# Patient Record
Sex: Female | Born: 1965 | Race: White | Hispanic: No | Marital: Married | Smoking: Former smoker
Health system: Southern US, Community
[De-identification: ages and names within clinical notes are randomized; demographics above are authoritative.]

## PROBLEM LIST (undated history)

## (undated) DIAGNOSIS — O009 Unspecified ectopic pregnancy without intrauterine pregnancy: Secondary | ICD-10-CM

## (undated) DIAGNOSIS — I10 Essential (primary) hypertension: Secondary | ICD-10-CM

## (undated) DIAGNOSIS — O039 Complete or unspecified spontaneous abortion without complication: Secondary | ICD-10-CM

## (undated) HISTORY — PX: CYST REMOVAL HAND: SHX6279

## (undated) HISTORY — DX: Essential (primary) hypertension: I10

## (undated) HISTORY — DX: Complete or unspecified spontaneous abortion without complication: O03.9

## (undated) HISTORY — PX: LIVER BIOPSY: SHX301

## (undated) HISTORY — PX: SALPINGECTOMY: SHX328

## (undated) HISTORY — DX: Unspecified ectopic pregnancy without intrauterine pregnancy: O00.90

---

## 2000-08-25 ENCOUNTER — Encounter: Payer: Self-pay | Admitting: Emergency Medicine

## 2000-08-25 ENCOUNTER — Emergency Department (HOSPITAL_COMMUNITY): Admission: EM | Admit: 2000-08-25 | Discharge: 2000-08-25 | Payer: Self-pay | Admitting: *Deleted

## 2010-01-14 ENCOUNTER — Ambulatory Visit: Payer: Self-pay | Admitting: Family Medicine

## 2016-01-06 ENCOUNTER — Other Ambulatory Visit: Payer: Self-pay | Admitting: Nurse Practitioner

## 2016-01-06 DIAGNOSIS — Z1231 Encounter for screening mammogram for malignant neoplasm of breast: Secondary | ICD-10-CM

## 2016-01-11 ENCOUNTER — Ambulatory Visit
Admission: RE | Admit: 2016-01-11 | Discharge: 2016-01-11 | Disposition: A | Discharge status: Home / Self Care | Payer: BLUE CROSS/BLUE SHIELD | Source: Ambulatory Visit | Attending: Nurse Practitioner | Admitting: Nurse Practitioner

## 2016-01-11 ENCOUNTER — Other Ambulatory Visit: Payer: Self-pay | Admitting: Nurse Practitioner

## 2016-01-11 DIAGNOSIS — Z1231 Encounter for screening mammogram for malignant neoplasm of breast: Secondary | ICD-10-CM | POA: Diagnosis present

## 2017-06-19 ENCOUNTER — Emergency Department
Admission: EM | Admit: 2017-06-19 | Discharge: 2017-06-19 | Disposition: A | Discharge status: Home / Self Care | Payer: BLUE CROSS/BLUE SHIELD | Attending: Emergency Medicine | Admitting: Emergency Medicine

## 2017-06-19 ENCOUNTER — Other Ambulatory Visit: Payer: Self-pay

## 2017-06-19 DIAGNOSIS — S39012A Strain of muscle, fascia and tendon of lower back, initial encounter: Secondary | ICD-10-CM | POA: Insufficient documentation

## 2017-06-19 DIAGNOSIS — X509XXA Other and unspecified overexertion or strenuous movements or postures, initial encounter: Secondary | ICD-10-CM | POA: Diagnosis not present

## 2017-06-19 DIAGNOSIS — Y92810 Car as the place of occurrence of the external cause: Secondary | ICD-10-CM | POA: Diagnosis not present

## 2017-06-19 DIAGNOSIS — Y9389 Activity, other specified: Secondary | ICD-10-CM | POA: Insufficient documentation

## 2017-06-19 DIAGNOSIS — Y998 Other external cause status: Secondary | ICD-10-CM | POA: Insufficient documentation

## 2017-06-19 DIAGNOSIS — S3992XA Unspecified injury of lower back, initial encounter: Secondary | ICD-10-CM | POA: Diagnosis present

## 2017-06-19 MED ORDER — BACLOFEN 10 MG PO TABS: 10.0000 mg | ORAL_TABLET | Freq: Every day | ORAL | 1 refills | Status: AC

## 2017-06-19 MED ORDER — METHYLPREDNISOLONE 4 MG PO TBPK: ORAL_TABLET | ORAL | 0 refills | Status: DC

## 2017-06-19 MED ORDER — TRAMADOL HCL 50 MG PO TABS: 50.0000 mg | ORAL_TABLET | Freq: Four times a day (QID) | ORAL | 0 refills | Status: DC | PRN

## 2017-06-19 NOTE — Discharge Instructions (Signed)
Use the medication as prescribed.  You may want to wait and start her steroid tomorrow morning.  Apply ice to the area that hurts.  You may use wet heat followed by ice.  You may want to see a chiropractor.  Or you could call orthopedics the phone numbers been provided for you if you are not better in 7-10 days.  Be careful and try not to lift anything greater than 10 pounds for the next week

## 2017-06-19 NOTE — ED Triage Notes (Signed)
Ptc/o lower back pain that started Thursday after working on a car.

## 2017-06-19 NOTE — ED Triage Notes (Signed)
FIRST NURSE NOTE-hurt lower back working on car. Ambulatory.

## 2017-06-19 NOTE — ED Provider Notes (Signed)
[facility] Emergency Department Provider Note  ____________________________________________   First MD Initiated Contact with Patient 06/19/17 1927     (approximate)  I have reviewed the triage vital signs and the nursing notes.   HISTORY  Chief Complaint Back Pain    HPI Melissa Johns is a 52 y.o. female is here complaining of low back pain.  She was leaning over her car and strained her back.  She is taking ibuprofen with very minimal relief.  She states the pain is a throbbing type pain.  She denies any other injuries.  She denies numbness or tingling in her legs.  History reviewed. No pertinent past medical history.  There are no active problems to display for this patient.   History reviewed. No pertinent surgical history.  Prior to Admission medications   Medication Sig Start Date End Date Taking? Authorizing Provider  baclofen (LIORESAL) 10 MG tablet Take 1 tablet (10 mg total) by mouth daily. 06/19/17 06/19/18  Latroya Ng, Roselyn BeringSusan W, PA-C  methylPREDNISolone (MEDROL DOSEPAK) 4 MG TBPK tablet Take 6 pills on day one then decrease by 1 pill each day 06/19/17   Faythe GheeFisher, Cachet Mccutchen W, PA-C  traMADol (ULTRAM) 50 MG tablet Take 1 tablet (50 mg total) by mouth every 6 (six) hours as needed. 06/19/17   Faythe GheeFisher, Clemens Lachman W, PA-C    Allergies Penicillins  Family History  Problem Relation Age of Onset  . Breast cancer Sister [id number]    Social History Social History   Tobacco Use  . Smoking status: Never Smoker  . Smokeless tobacco: Never Used  Substance Use Topics  . Alcohol use: No    Frequency: Never  . Drug use: No    Review of Systems  Constitutional: No fever/chills Eyes: No visual changes. ENT: No sore throat. Respiratory: Denies cough Gastrointestinal: Denies abdominal pain Genitourinary: Negative for dysuria. Musculoskeletal: Positive for back pain. Skin: Negative for rash.    ____________________________________________   PHYSICAL  EXAM:  VITAL SIGNS: ED Triage Vitals  Enc Vitals Group     BP 06/19/17 1857 (!) 150/99     Pulse Rate 06/19/17 1856 87     Resp 06/19/17 1856 17     Temp 06/19/17 1856 97.9 F (36.6 C)     Temp Source 06/19/17 1856 Oral     SpO2 06/19/17 1856 97 %     Weight 06/19/17 1857 (!) 332 lb (150.6 kg)     Height 06/19/17 1857 5\' 10"  (1.778 m)     Head Circumference --      Peak Flow --      Pain Score 06/19/17 1856 8     Pain Loc --      Pain Edu? --      Excl. in GC? --     Constitutional: Alert and oriented. Well appearing and in no acute distress. Eyes: Conjunctivae are normal.  Head: Atraumatic. Nose: No congestion/rhinnorhea. Mouth/Throat: Mucous membranes are moist.   Cardiovascular: Normal rate, regular rhythm. Respiratory: Normal respiratory effort.  No retractions GU: deferred Musculoskeletal: FROM all extremities, warm and well perfused.  The lumbar spine is minimally tender.  The paravertebral muscles and the muscles leading to the SI joint are spasmed and tender.  Patient has full reflexes.  She has full strength in both legs.  She is neurovascularly intact.  Pain is reproduced with bending forward Neurologic:  Normal speech and language.  Skin:  Skin is warm, dry and intact. No rash noted. Psychiatric: Mood and affect  are normal. Speech and behavior are normal.  ____________________________________________   LABS (all labs ordered are listed, but only abnormal results are displayed)  Labs Reviewed - No data to display ____________________________________________   ____________________________________________  RADIOLOGY    ____________________________________________   PROCEDURES  Procedure(s) performed: No  Procedures    ____________________________________________   INITIAL IMPRESSION / ASSESSMENT AND PLAN / ED COURSE  Pertinent labs & imaging results that were available during my care of the patient were reviewed by me and considered in my  medical decision making (see chart for details).  Patient is a 52 year old female reporting to the emergency department complaining of low back pain after bending over her car for too long.  Physical exam the lumbar spine and paravertebral muscles are tender and spasmed  Is diagnosed with lumbar strain.  She is given a prescription for Medrol Dosepak, baclofen, and tramadol 50 mg 1 every 6 hours as needed for pain #15 with no refill.  She is to apply ice followed by wet heat.  She is to follow-up with orthopedics or a chiropractor if not better in 5-7 days.  She is not to lift over 10 pounds while at work.  Patient states she understands comply with recommendations.  She was discharged in stable condition     As part of my medical decision making, I reviewed the following data within the electronic MEDICAL RECORD NUMBER Nursing notes reviewed and incorporated, Old chart reviewed, Notes from prior ED visits and [place] Controlled Substance Database  ____________________________________________   FINAL CLINICAL IMPRESSION(S) / ED DIAGNOSES  Final diagnoses:  Strain of lumbar region, initial encounter      NEW MEDICATIONS STARTED DURING THIS VISIT:  New Prescriptions   BACLOFEN (LIORESAL) 10 MG TABLET    Take 1 tablet (10 mg total) by mouth daily.   METHYLPREDNISOLONE (MEDROL DOSEPAK) 4 MG TBPK TABLET    Take 6 pills on day one then decrease by 1 pill each day   TRAMADOL (ULTRAM) 50 MG TABLET    Take 1 tablet (50 mg total) by mouth every 6 (six) hours as needed.     Note:  This document was prepared using Dragon voice recognition software and may include unintentional dictation errors.    Faythe Ghee, PA-C 06/19/17 Jamal Collin, MD 06/19/17 [phone]

## 2017-06-19 NOTE — ED Notes (Signed)
Pulled left side of back on Thursday working on a car. Has taken ibuprofen with minimal relief. No medications today

## 2018-01-27 IMAGING — MG MM DIGITAL SCREENING BILAT W/ TOMO W/ CAD
8 of 15 series · 8 of 31 positions shown · non-contrast
Comparison: Previous exam(s).

CLINICAL DATA: Screening.

EXAM:
2D DIGITAL SCREENING BILATERAL MAMMOGRAM WITH CAD AND ADJUNCT TOMO

[R CC (1 of 2)]
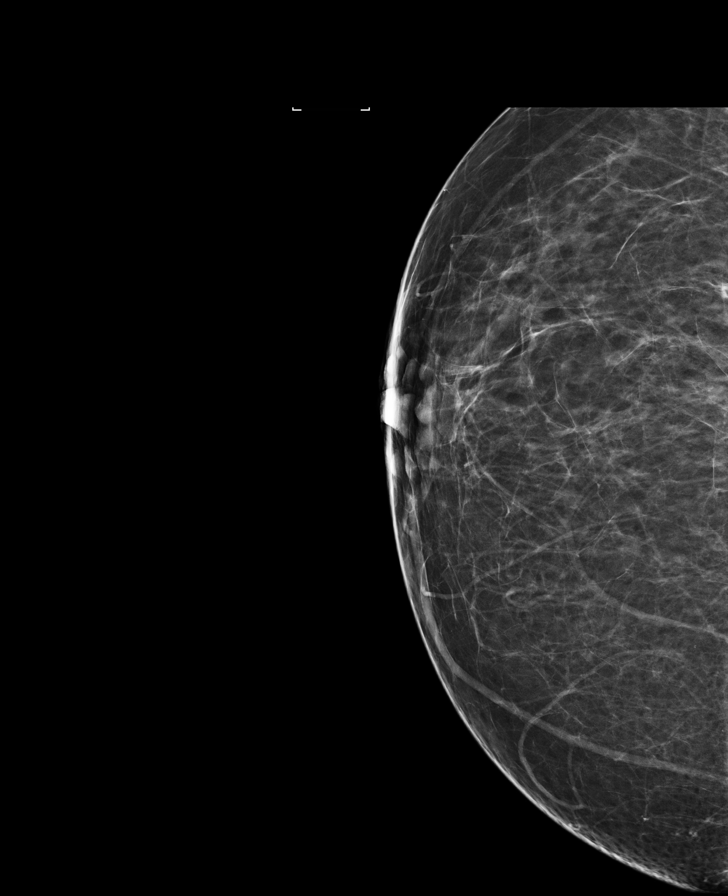

[L CC (1 of 2)]
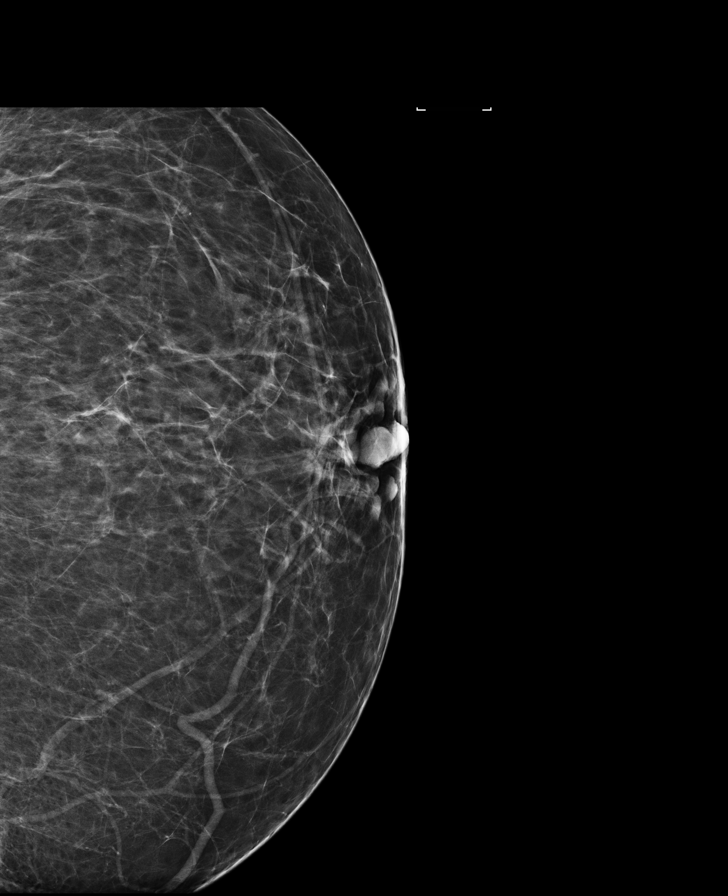

[L CC (2 of 2)]
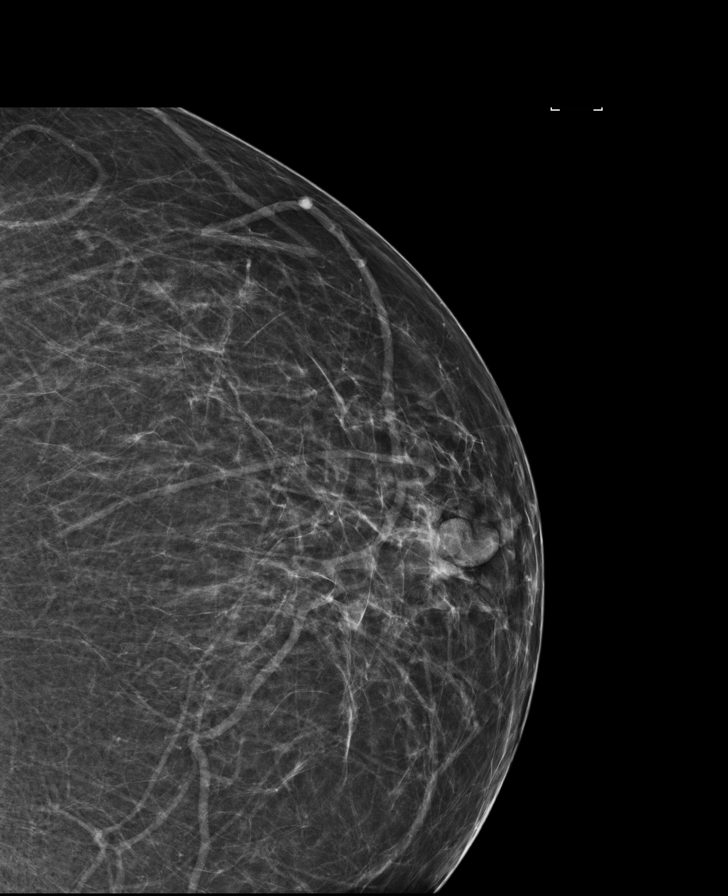

[R CC (2 of 2)]
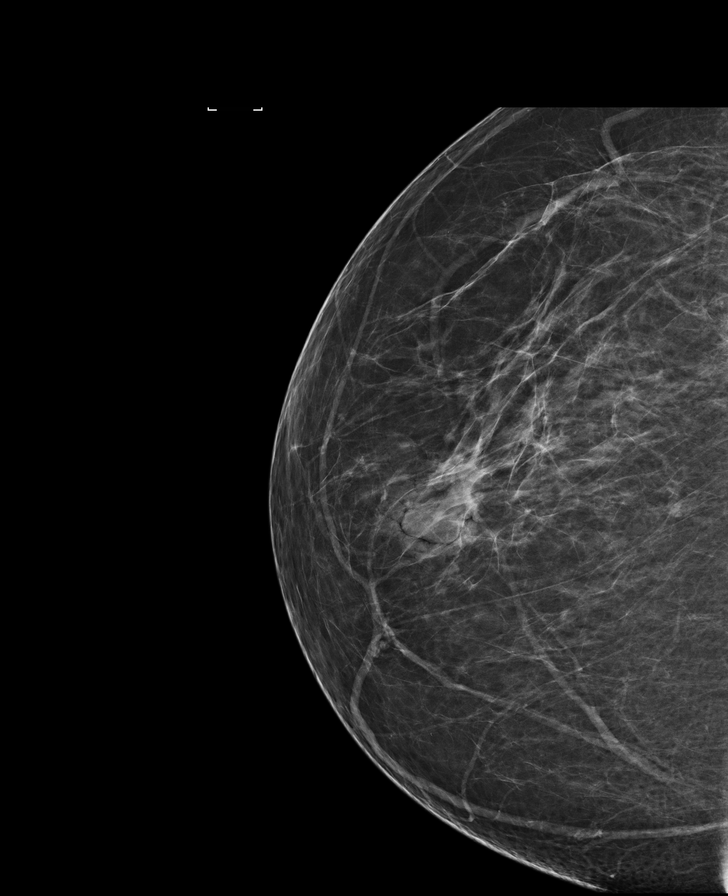

[L MLO synth-2D]
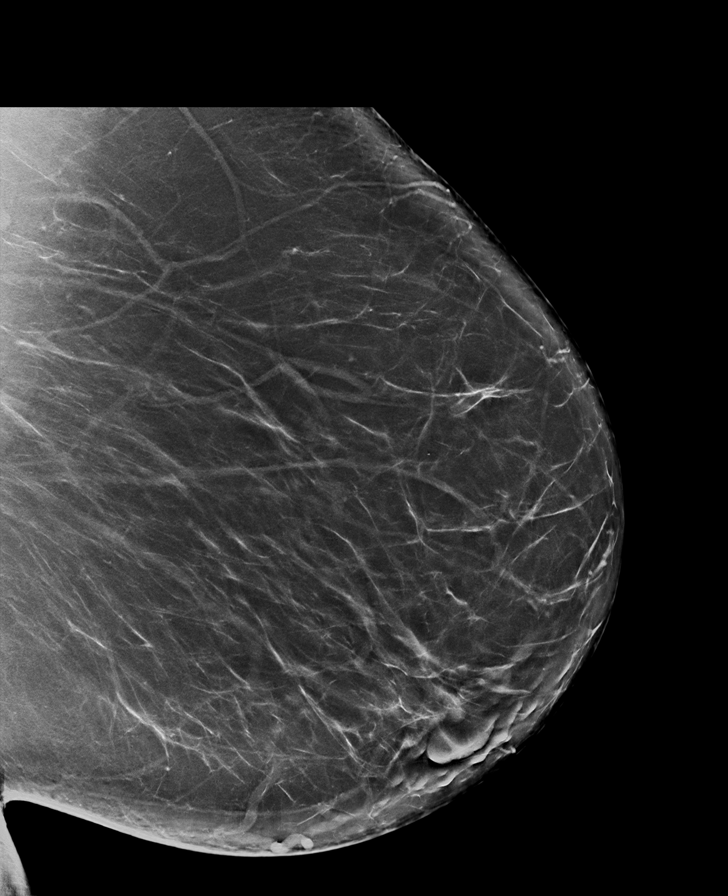

[R MLO]
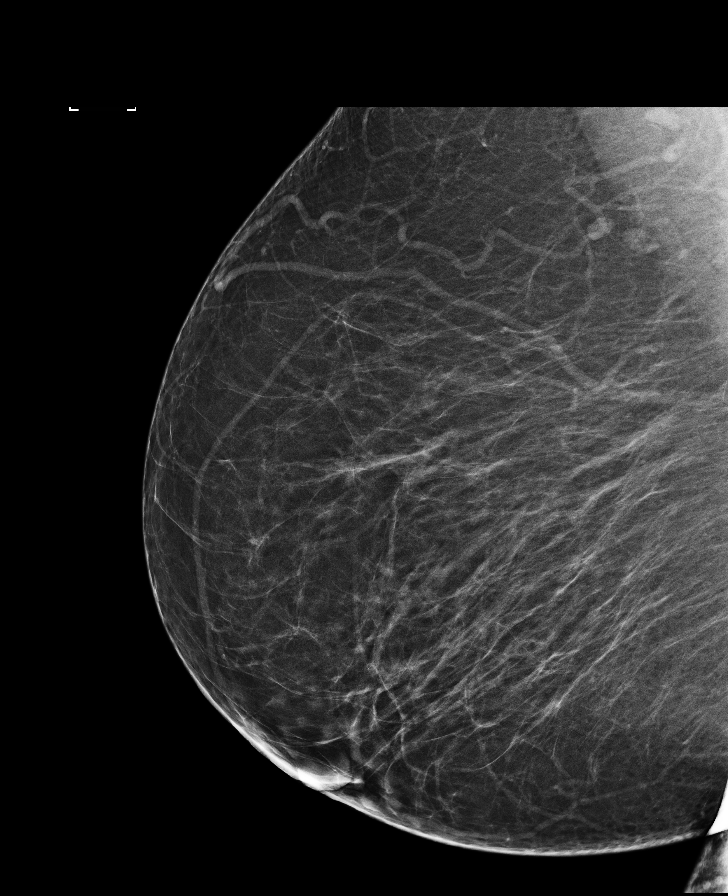

[L CC synth-2D]
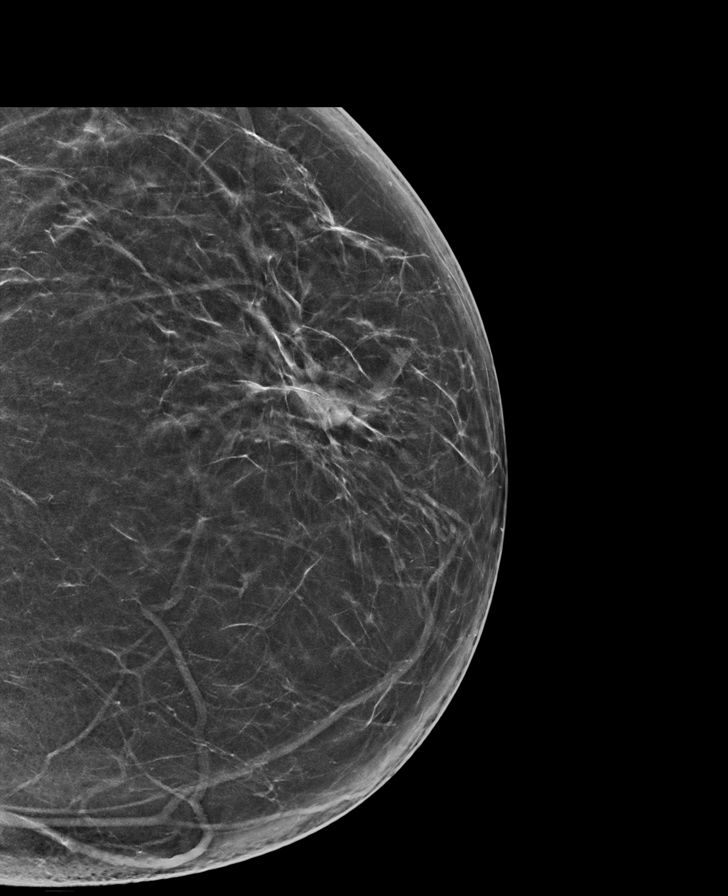

[L MLO]
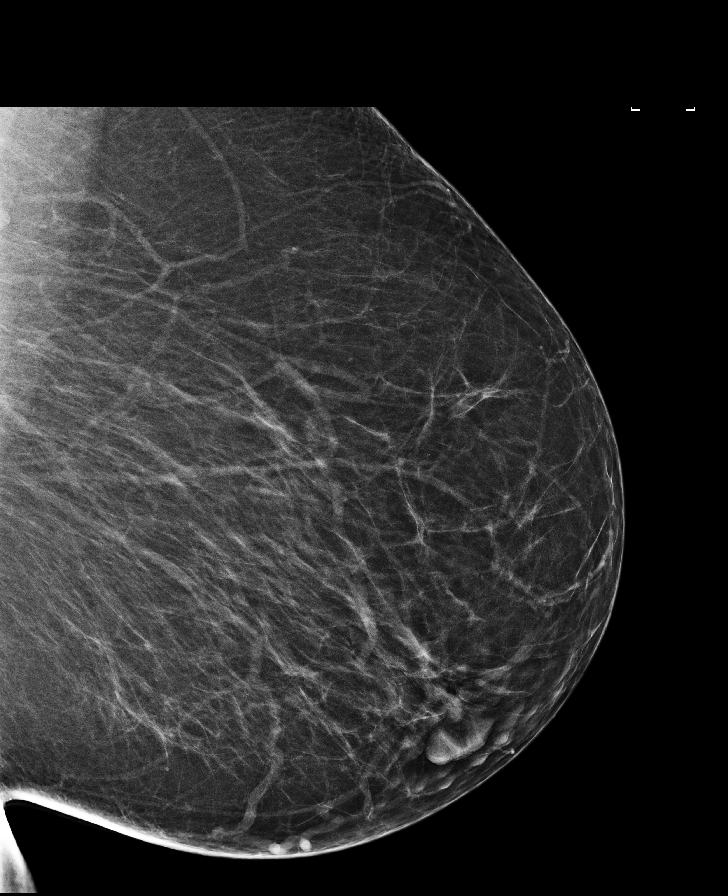

[8 of 31 positions shown; findings below may reference images not displayed]

ACR Breast Density Category b: There are scattered areas of
fibroglandular density.
FINDINGS: There are no findings suspicious for malignancy. Images were
processed with CAD.
IMPRESSION: No mammographic evidence of malignancy. A result letter of this
screening mammogram will be mailed directly to the patient.

RECOMMENDATION:
Screening mammogram in one year. (Code:97-6-RS4)

BI-RADS CATEGORY  1: Negative.

## 2019-11-11 ENCOUNTER — Ambulatory Visit: Payer: Self-pay

## 2019-11-11 DIAGNOSIS — Z23 Encounter for immunization: Secondary | ICD-10-CM

## 2019-11-11 NOTE — Progress Notes (Signed)
   Covid-19 Vaccination Clinic  Name:  Melissa Johns    MRN: [id number] DOB: 1965-07-15  11/11/2019  Melissa Johns was observed post Covid-19 immunization for 15 minutes without incident. She was provided with Vaccine Information Sheet and instruction to access the V-Safe system.   Melissa Johns was instructed to call 911 with any severe reactions post vaccine: Marland Kitchen Difficulty breathing  . Swelling of face and throat  . A fast heartbeat  . A bad rash all over body  . Dizziness and weakness   Immunizations Administered    Name Date Dose VIS Date Route   Moderna COVID-19 Vaccine 11/11/2019 11:56 AM 0.5 mL 04/2019 Intramuscular   Manufacturer: Moderna   Lot: [id number]   NDC: 75916-384-66

## 2019-12-09 ENCOUNTER — Ambulatory Visit: Payer: Self-pay | Attending: Internal Medicine

## 2019-12-09 DIAGNOSIS — Z23 Encounter for immunization: Secondary | ICD-10-CM

## 2019-12-09 NOTE — Progress Notes (Signed)
° °  Covid-19 Vaccination Clinic  Name:  Melissa Johns    MRN: [id number] DOB: 1965-09-20  12/09/2019  [name] was observed post Covid-19 immunization for 15 minutes without incident. She was provided with Vaccine Information Sheet and instruction to access the V-Safe system.   [name] was instructed to call 911 with any severe reactions post vaccine:  Difficulty breathing   Swelling of face and throat   A fast heartbeat   A bad rash all over body   Dizziness and weakness   Immunizations Administered    Name Date Dose VIS Date Route   Moderna COVID-19 Vaccine 12/09/2019 12:09 PM 0.5 mL 04/2019 Intramuscular   Manufacturer: Gala Murdoch   Lot: [id number]   NDC: [id number]

## 2024-02-18 ENCOUNTER — Emergency Department

## 2024-02-18 ENCOUNTER — Observation Stay
Admission: EM | Admit: 2024-02-18 | Discharge: 2024-02-19 | Disposition: A | Discharge status: Home / Self Care | Attending: Family Medicine | Admitting: Family Medicine

## 2024-02-18 ENCOUNTER — Other Ambulatory Visit: Payer: Self-pay

## 2024-02-18 DIAGNOSIS — C787 Secondary malignant neoplasm of liver and intrahepatic bile duct: Secondary | ICD-10-CM

## 2024-02-18 DIAGNOSIS — Z6835 Body mass index (BMI) 35.0-35.9, adult: Secondary | ICD-10-CM | POA: Insufficient documentation

## 2024-02-18 DIAGNOSIS — R109 Unspecified abdominal pain: Secondary | ICD-10-CM | POA: Diagnosis not present

## 2024-02-18 DIAGNOSIS — R911 Solitary pulmonary nodule: Secondary | ICD-10-CM | POA: Diagnosis not present

## 2024-02-18 DIAGNOSIS — I10 Essential (primary) hypertension: Secondary | ICD-10-CM | POA: Insufficient documentation

## 2024-02-18 DIAGNOSIS — I517 Cardiomegaly: Secondary | ICD-10-CM | POA: Diagnosis not present

## 2024-02-18 DIAGNOSIS — E876 Hypokalemia: Secondary | ICD-10-CM | POA: Insufficient documentation

## 2024-02-18 DIAGNOSIS — C189 Malignant neoplasm of colon, unspecified: Secondary | ICD-10-CM | POA: Diagnosis not present

## 2024-02-18 DIAGNOSIS — R10A1 Flank pain, right side: Secondary | ICD-10-CM | POA: Diagnosis not present

## 2024-02-18 DIAGNOSIS — K769 Liver disease, unspecified: Secondary | ICD-10-CM

## 2024-02-18 DIAGNOSIS — K7689 Other specified diseases of liver: Secondary | ICD-10-CM | POA: Insufficient documentation

## 2024-02-18 DIAGNOSIS — R0602 Shortness of breath: Secondary | ICD-10-CM | POA: Diagnosis present

## 2024-02-18 DIAGNOSIS — C19 Malignant neoplasm of rectosigmoid junction: Secondary | ICD-10-CM | POA: Insufficient documentation

## 2024-02-18 DIAGNOSIS — G893 Neoplasm related pain (acute) (chronic): Secondary | ICD-10-CM

## 2024-02-18 DIAGNOSIS — K6289 Other specified diseases of anus and rectum: Secondary | ICD-10-CM | POA: Diagnosis not present

## 2024-02-18 LAB — COMPREHENSIVE METABOLIC PANEL WITH GFR
ALT: 22 U/L (ref 0–44)
AST: 57 U/L — ABNORMAL HIGH (ref 15–41)
Albumin: 3.4 g/dL — ABNORMAL LOW (ref 3.5–5.0)
Alkaline Phosphatase: 129 U/L — ABNORMAL HIGH (ref 38–126)
Anion gap: 13 (ref 5–15)
BUN: 17 mg/dL (ref 6–20)
CO2: 25 mmol/L (ref 22–32)
Calcium: 9.2 mg/dL (ref 8.9–10.3)
Chloride: 98 mmol/L (ref 98–111)
Creatinine, Ser: 0.46 mg/dL (ref 0.44–1.00)
GFR, Estimated: >60 mL/min (ref >60)
Glucose, Bld: 119 mg/dL — ABNORMAL HIGH (ref 70–99)
Potassium: 3.3 mmol/L — ABNORMAL LOW (ref 3.5–5.1)
Sodium: 136 mmol/L (ref 135–145)
Total Bilirubin: 0.9 mg/dL (ref 0.0–1.2)
Total Protein: 7.7 g/dL (ref 6.5–8.1)

## 2024-02-18 LAB — URINALYSIS, ROUTINE W REFLEX MICROSCOPIC
Bacteria, UA: NONE SEEN
Glucose, UA: NEGATIVE mg/dL
Hgb urine dipstick: NEGATIVE
Ketones, ur: NEGATIVE mg/dL
Leukocytes,Ua: NEGATIVE
Nitrite: NEGATIVE
Protein, ur: 100 mg/dL — AB
Specific Gravity, Urine: 1.034 — ABNORMAL HIGH (ref 1.005–1.030)
pH: 5 (ref 5.0–8.0)

## 2024-02-18 LAB — RESP PANEL BY RT-PCR (RSV, FLU A&B, COVID)  RVPGX2
Influenza A by PCR: NEGATIVE
Influenza B by PCR: NEGATIVE
Resp Syncytial Virus by PCR: NEGATIVE
SARS Coronavirus 2 by RT PCR: NEGATIVE

## 2024-02-18 LAB — LIPASE, BLOOD: Lipase: 21 U/L (ref 11–51)

## 2024-02-18 LAB — CBC
HCT: 38.5 % (ref 36.0–46.0)
Hemoglobin: 12.4 g/dL (ref 12.0–15.0)
MCH: 25.8 pg — ABNORMAL LOW (ref 26.0–34.0)
MCHC: 32.2 g/dL (ref 30.0–36.0)
MCV: 80.2 fL (ref 80.0–100.0)
Platelets: 414 K/uL — ABNORMAL HIGH (ref 150–400)
RBC: 4.8 MIL/uL (ref 3.87–5.11)
RDW: 14.7 % (ref 11.5–15.5)
WBC: 14.9 K/uL — ABNORMAL HIGH (ref 4.0–10.5)
nRBC: 0 % (ref 0.0–0.2)

## 2024-02-18 LAB — PROTIME-INR
INR: 1.1 (ref 0.8–1.2)
Prothrombin Time: 14.7 s (ref 11.4–15.2)

## 2024-02-18 LAB — MAGNESIUM: Magnesium: 2.2 mg/dL (ref 1.7–2.4)

## 2024-02-18 MED ORDER — ONDANSETRON HCL 4 MG/2ML IJ SOLN: 4.0000 mg | Freq: Four times a day (QID) | INTRAMUSCULAR | Status: DC | PRN

## 2024-02-18 MED ORDER — ACETAMINOPHEN 325 MG PO TABS: 650.0000 mg | ORAL_TABLET | Freq: Four times a day (QID) | ORAL | Status: DC | PRN

## 2024-02-18 MED ORDER — HYDROCHLOROTHIAZIDE 12.5 MG PO TABS
12.5000 mg | ORAL_TABLET | Freq: Every day | ORAL | Status: DC
  Administered 2024-02-18: 12.5 mg via ORAL
  Filled 2024-02-18 (×2): qty 1

## 2024-02-18 MED ORDER — ONDANSETRON HCL 4 MG PO TABS: 4.0000 mg | ORAL_TABLET | Freq: Four times a day (QID) | ORAL | Status: DC | PRN

## 2024-02-18 MED ORDER — SENNOSIDES-DOCUSATE SODIUM 8.6-50 MG PO TABS: 1.0000 | ORAL_TABLET | Freq: Every evening | ORAL | Status: DC | PRN

## 2024-02-18 MED ORDER — IOHEXOL 350 MG/ML SOLN
75.0000 mL | Freq: Once | INTRAVENOUS | Status: AC | PRN
  Administered 2024-02-18: 75 mL via INTRAVENOUS

## 2024-02-18 MED ORDER — MORPHINE SULFATE (PF) 4 MG/ML IV SOLN
4.0000 mg | Freq: Once | INTRAVENOUS | Status: AC
  Administered 2024-02-18: 4 mg via INTRAVENOUS
  Filled 2024-02-18: qty 1

## 2024-02-18 MED ORDER — LISINOPRIL-HYDROCHLOROTHIAZIDE 20-12.5 MG PO TABS: 1.0000 | ORAL_TABLET | Freq: Every day | ORAL | 2 refills | Status: DC

## 2024-02-18 MED ORDER — IOHEXOL 300 MG/ML  SOLN
100.0000 mL | Freq: Once | INTRAMUSCULAR | Status: AC | PRN
  Administered 2024-02-18: 100 mL via INTRAVENOUS

## 2024-02-18 MED ORDER — OXYCODONE HCL 5 MG PO TABS
5.0000 mg | ORAL_TABLET | ORAL | Status: DC | PRN
  Administered 2024-02-18 (×2): 5 mg via ORAL
  Filled 2024-02-18 (×3): qty 1

## 2024-02-18 MED ORDER — ACETAMINOPHEN 650 MG RE SUPP: 650.0000 mg | Freq: Four times a day (QID) | RECTAL | Status: DC | PRN

## 2024-02-18 MED ORDER — HYDROMORPHONE HCL 1 MG/ML IJ SOLN
0.5000 mg | Freq: Once | INTRAMUSCULAR | Status: AC
  Administered 2024-02-18: 0.5 mg via INTRAVENOUS
  Filled 2024-02-18: qty 0.5

## 2024-02-18 MED ORDER — LISINOPRIL 20 MG PO TABS
20.0000 mg | ORAL_TABLET | Freq: Every day | ORAL | Status: DC
  Administered 2024-02-18: 20 mg via ORAL
  Filled 2024-02-18 (×2): qty 1

## 2024-02-18 MED ORDER — MORPHINE SULFATE (PF) 2 MG/ML IV SOLN
2.0000 mg | INTRAVENOUS | Status: DC | PRN
  Administered 2024-02-18 (×2): 2 mg via INTRAVENOUS
  Filled 2024-02-18 (×2): qty 1

## 2024-02-18 MED ORDER — POTASSIUM CHLORIDE CRYS ER 20 MEQ PO TBCR
40.0000 meq | EXTENDED_RELEASE_TABLET | Freq: Once | ORAL | Status: AC
  Administered 2024-02-18: 40 meq via ORAL
  Filled 2024-02-18: qty 2

## 2024-02-18 MED ORDER — BISACODYL 5 MG PO TBEC: 5.0000 mg | DELAYED_RELEASE_TABLET | Freq: Every day | ORAL | Status: DC | PRN

## 2024-02-18 MED ORDER — ONDANSETRON HCL 4 MG/2ML IJ SOLN
4.0000 mg | Freq: Once | INTRAMUSCULAR | Status: AC
Start: 2024-02-18 — End: 2024-02-18
  Administered 2024-02-18: 4 mg via INTRAVENOUS
  Filled 2024-02-18: qty 2

## 2024-02-18 NOTE — ED Notes (Signed)
 EDP, Ray at bedside; update provided to patient.

## 2024-02-18 NOTE — ED Notes (Signed)
 Patient transported to CT

## 2024-02-18 NOTE — ED Triage Notes (Addendum)
 Pt to ED via POV from home. Pt reports intermittent symptoms x2 wks of SOB, diarrhea, epigastric pressure and right flank pain. Pt reports negative at home COVID and flu. Pt reports has not established care with new PCP and is out of medications

## 2024-02-18 NOTE — Plan of Care (Signed)

## 2024-02-18 NOTE — ED Notes (Signed)
 Advised nurse that patient has ready bed

## 2024-02-18 NOTE — H&P (Addendum)
 History and Physical    Melissa Johns FMW:[id number] DOB: 1965/06/03 DOA: 02/18/2024  PCP: Patient, No Pcp Per (Confirm with patient/family/NH records and if not entered, this has to be entered at [facility] point of entry) Patient coming from: Home  I have personally briefly reviewed patient's old medical records in [facility] Link  Chief Complaint: Still having belly pain  HPI: Melissa Johns is a 58 y.o. female with medical history significant of HTN, morbid obesity, presented with onset of abdominal pain.  Patient reported that she had intermittent loose diarrhea for the last 2 weeks.  Yesterday, patient started to have cramping like abdominal pain, denied any nausea vomiting, no fever or chills no chest pain.  No blood in stool.  No weight loss.  She tried OTC medication with no significant improvement and decided to come to ED.  Was diagnosed with high blood pressure but out of medication for more than a year due to insurance changes.  Thoracic/abdominal/pelvic adenopathy.  ED Course: Afebrile, nontachycardic blood pressure 160/69.  CT abdomen pelvis showed likely rectal versus colorectal cancer with diffused metastatic to liver, CTA negative for PE but cardiomegaly and mediastinal adenopathy likely metastatic, as well as scattered lung metastasis.  Blood work showed glucose 119 BUN 17 creatinine 0.4, AST 57 ALT 22 bilirubin 0.9, WBC 14.9 hemoglobin 12.4, platelet 414.  Review of Systems: As per HPI otherwise 14 point review of systems negative.    History reviewed. No pertinent past medical history.  History reviewed. No pertinent surgical history.   reports that she has never smoked. She has never used smokeless tobacco. She reports that she does not drink alcohol and does not use drugs.  Allergies  Allergen Reactions   Penicillins Other (See Comments)    joints lock up    Family History  Problem Relation Age of Onset   Breast cancer Sister 80     Prior to Admission  medications   Medication Sig Start Date End Date Taking? Authorizing Provider  methylPREDNISolone  (MEDROL  DOSEPAK) 4 MG TBPK tablet Take 6 pills on day one then decrease by 1 pill each day 06/19/17   Gasper Devere ORN, PA-C  traMADol  (ULTRAM ) 50 MG tablet Take 1 tablet (50 mg total) by mouth every 6 (six) hours as needed. 06/19/17   Gasper Devere ORN, PA-C    Physical Exam: Vitals:   02/18/24 1009 02/18/24 1010  BP:  (!) 160/69  Pulse: 89   Resp: 20   Temp: 98 F (36.7 C)   TempSrc: Oral   SpO2: 98%     Constitutional: NAD, calm, comfortable Vitals:   02/18/24 1009 02/18/24 1010  BP:  (!) 160/69  Pulse: 89   Resp: 20   Temp: 98 F (36.7 C)   TempSrc: Oral   SpO2: 98%    Eyes: PERRL, lids and conjunctivae normal ENMT: Mucous membranes are moist. Posterior pharynx clear of any exudate or lesions.Normal dentition.  Neck: normal, supple, no masses, no thyromegaly Respiratory: clear to auscultation bilaterally, no wheezing, no crackles. Normal respiratory effort. No accessory muscle use.  Cardiovascular: Regular rate and rhythm, no murmurs / rubs / gallops. No extremity edema. 2+ pedal pulses. No carotid bruits.  Abdomen: Mass on left flank with tenderness to touch but no rebound or guarding, no masses palpated. No hepatosplenomegaly. Bowel sounds positive.  Musculoskeletal: no clubbing / cyanosis. No joint deformity upper and lower extremities. Good ROM, no contractures. Normal muscle tone.  Skin: no rashes, lesions, ulcers. No induration Neurologic: CN  2-12 grossly intact. Sensation intact, DTR normal. Strength 5/5 in all 4.  Psychiatric: Normal judgment and insight. Alert and oriented x 3. Normal mood.     Labs on Admission: I have personally reviewed following labs and imaging studies  CBC: Recent Labs  Lab 02/18/24 1011  WBC 14.9*  HGB 12.4  HCT 38.5  MCV 80.2  PLT 414*   Basic Metabolic Panel: Recent Labs  Lab 02/18/24 1011  NA 136  K 3.3*  CL 98  CO2 25   GLUCOSE 119*  BUN 17  CREATININE 0.46  CALCIUM 9.2   GFR: CrCl cannot be calculated (Unknown ideal weight.). Liver Function Tests: Recent Labs  Lab 02/18/24 1011  AST 57*  ALT 22  ALKPHOS 129*  BILITOT 0.9  PROT 7.7  ALBUMIN 3.4*   Recent Labs  Lab 02/18/24 1011  LIPASE 21   No results for input(s): AMMONIA in the last 168 hours. Coagulation Profile: No results for input(s): INR, PROTIME in the last 168 hours. Cardiac Enzymes: No results for input(s): CKTOTAL, CKMB, CKMBINDEX, TROPONINI in the last 168 hours. BNP (last 3 results) No results for input(s): PROBNP in the last 8760 hours. HbA1C: No results for input(s): HGBA1C in the last 72 hours. CBG: No results for input(s): GLUCAP in the last 168 hours. Lipid Profile: No results for input(s): CHOL, HDL, LDLCALC, TRIG, CHOLHDL, LDLDIRECT in the last 72 hours. Thyroid Function Tests: No results for input(s): TSH, T4TOTAL, FREET4, T3FREE, THYROIDAB in the last 72 hours. Anemia Panel: No results for input(s): VITAMINB12, FOLATE, FERRITIN, TIBC, IRON, RETICCTPCT in the last 72 hours. Urine analysis:    Component Value Date/Time   COLORURINE AMBER (A) 02/18/2024 1121   APPEARANCEUR HAZY (A) 02/18/2024 1121   LABSPEC 1.034 (H) 02/18/2024 1121   PHURINE 5.0 02/18/2024 1121   GLUCOSEU NEGATIVE 02/18/2024 1121   HGBUR NEGATIVE 02/18/2024 1121   BILIRUBINUR SMALL (A) 02/18/2024 1121   KETONESUR NEGATIVE 02/18/2024 1121   PROTEINUR 100 (A) 02/18/2024 1121   NITRITE NEGATIVE 02/18/2024 1121   LEUKOCYTESUR NEGATIVE 02/18/2024 1121    Radiological Exams on Admission: CT Angio Chest PE W and/or Wo Contrast Result Date: 02/18/2024 CLINICAL DATA:  Shortness of breath. Metastatic disease identified on abdomen and pelvis CT earlier today. * Tracking Code: BO * EXAM: CT ANGIOGRAPHY CHEST WITH CONTRAST TECHNIQUE: Multidetector CT imaging of the chest was performed using the  standard protocol during bolus administration of intravenous contrast. Multiplanar CT image reconstructions and MIPs were obtained to evaluate the vascular anatomy. RADIATION DOSE REDUCTION: This exam was performed according to the departmental dose-optimization program which includes automated exposure control, adjustment of the mA and/or kV according to patient size and/or use of iterative reconstruction technique. CONTRAST:  75mL OMNIPAQUE IOHEXOL 350 MG/ML SOLN COMPARISON:  None Available. FINDINGS: Cardiovascular: Heart size mildly enlarged. No substantial pericardial effusion. Mild atherosclerotic calcification is noted in the wall of the thoracic aorta. Enlargement of the pulmonary outflow tract/main pulmonary arteries suggests pulmonary arterial hypertension. No large central pulmonary embolus in the main or lobar pulmonary arteries. No definite segmental or subsegmental pulmonary embolus evident although assessment is limited by bolus timing and evaluation of these arteries may be unreliable. Mediastinum/Nodes: Bulky mediastinal lymphadenopathy including a 2.7 cm short axis right paratracheal lymph node and 2.5 cm short axis prevascular lymph node. There is bilateral hilar lymphadenopathy. The esophagus has normal imaging features. There is no axillary lymphadenopathy. Lungs/Pleura: Rr 18 mm parahilar right lung nodule identified on 65/6. 7 mm right lower  lobe pulmonary nodule identified on 72/6. Upper Abdomen: See report for abdomen/pelvis CT performed immediately prior to this study and dictated separately. Musculoskeletal: No worrisome lytic or sclerotic osseous abnormality. Review of the MIP images confirms the above findings. IMPRESSION: 1. No large central pulmonary embolus in the main or lobar pulmonary arteries. No definite segmental or subsegmental pulmonary embolus evident although assessment is limited by bolus timing and evaluation of these arteries may be unreliable. 2. Bulky mediastinal and  bilateral hilar lymphadenopathy. Imaging features are compatible with metastatic disease. 3. 18 mm parahilar right lung nodule with 7 mm right lower lobe pulmonary nodule. Imaging features are compatible with metastatic disease. 4. Enlargement of the pulmonary outflow tract/main pulmonary arteries suggests pulmonary arterial hypertension. 5.  Aortic Atherosclerosis (ICD10-I70.0). Electronically Signed   By: Camellia Candle M.D.   On: 02/18/2024 13:50   CT ABDOMEN PELVIS W CONTRAST Result Date: 02/18/2024 CLINICAL DATA:  Shortness of breath. Diarrhea. Right flank pain. * Tracking Code: BO * EXAM: CT ABDOMEN AND PELVIS WITH CONTRAST TECHNIQUE: Multidetector CT imaging of the abdomen and pelvis was performed using the standard protocol following bolus administration of intravenous contrast. RADIATION DOSE REDUCTION: This exam was performed according to the departmental dose-optimization program which includes automated exposure control, adjustment of the mA and/or kV according to patient size and/or use of iterative reconstruction technique. CONTRAST:  100mL OMNIPAQUE IOHEXOL 300 MG/ML  SOLN COMPARISON:  None Available. FINDINGS: Lower chest: Mild cardiomegaly, without pericardial or pleural effusion. Marked lower thoracic adenopathy, with a node in the azygoesophageal recess measuring 2.5 x 3.3 cm on 01/02. Hepatobiliary: Hepatomegaly at 27.1 cm craniocaudal. Irregular hepatic capsule which is most likely due multiple hepatic masses. The dominant segment 4 B mass measures 10.1 x 9.4 cm on 35/2 and causes upstream mild intrahepatic duct dilatation in segment 2 on image 28/2. Infiltrative right-sided masses including at 7.8 x 5.3 cm on 47/2. Normal gallbladder, without biliary ductal dilatation. Pancreas: Normal, without mass or ductal dilatation. Spleen: Normal in size, without focal abnormality. Adrenals/Urinary Tract: Left larger than right adrenal nodules. Example on the left at 3.3 cm and 8 HU. On the right at  1.8 cm and 15 HU. Upper pole right renal sinus cyst of 2.3 cm. Normal left kidney. No hydronephrosis. Normal urinary bladder. Stomach/Bowel: Normal stomach, without wall thickening. Eccentric right left rectal wall thickening on image 84/2. more significant eccentric right rectosigmoid wall thickening including on 76/2. Example 2.6 x 3.0 cm. Extensive colonic diverticulosis. Colonic stool burden suggests constipation. Normal terminal ileum and appendix. Normal small bowel. Vascular/Lymphatic: Aortic atherosclerosis. Significant abdominal retroperitoneal adenopathy. Index gastrohepatic ligament node measures 2.2 cm on 21/2. No pelvic sidewall adenopathy. There is extensive adenopathy within the sigmoid mesocolon with a nodal mass measuring 3.8 x 4.2 cm on 70/2. Reproductive: Normal uterus and adnexa. Other: Trace perihepatic ascites. No evidence of omental or peritoneal disease. Musculoskeletal: No acute osseous abnormality. IMPRESSION: 1. Constellation of findings which are consistent with widespread metastatic disease. Massive lower thoracic, mild-to-moderate abdominal, and moderate pelvic adenopathy. Infiltrative bilateral hepatic masses. 2. The primary is most likely in the rectum or rectosigmoid colon, with areas of eccentric right wall thickening as detailed above. 3. Possible constipation. No evidence of high-grade bowel obstruction. 4. Left larger than right adrenal lesions. The left-sided lesion is consistent with an adenoma. The right-sided lesion is technically indeterminate but favored to represent an adenoma. 5. Segment 2 intrahepatic biliary duct dilatation secondary to the dominant segment 4A mass. Consider correlation with bilirubin levels.  6. Trace perihepatic ascites. 7. Incidental findings, including: Cardiomegaly. Aortic Atherosclerosis (ICD10-I70.0). Electronically Signed   By: Rockey Kilts M.D.   On: 02/18/2024 12:50   DG Chest 2 View Result Date: 02/18/2024 CLINICAL DATA:  Shortness of  breath and epigastric pain. EXAM: CHEST - 2 VIEW COMPARISON:  None Available. FINDINGS: The heart size and mediastinal contours are within normal limits. No acute infiltrate, pleural effusion or pneumothorax identified. The visualized skeletal structures are unremarkable. IMPRESSION: No active cardiopulmonary disease. Electronically Signed   By: Suzen Dials M.D.   On: 02/18/2024 10:31    EKG: Independently reviewed.  Sinus rhythm, no acute ST changes, LVH.  Assessment/Plan Principal Problem:   Abdominal pain Active Problems:   Colorectal cancer, stage IV (HCC)  (please populate well all problems here in Problem List. (For example, if patient is on BP meds at home and you resume or decide to hold them, it is a problem that needs to be her. Same for CAD, COPD, HLD and so on)  Intractable abdominal pain Metastatic colorectal cancer - Image/urology extensive metastasis lesions to abdomen/pelvic adenopathy, mediastinum, liver and lungs.  Oncology consulted by ED physician, recommended liver biopsy. - Stable for pain control, will use alternate oxycodone plus morphine regimen.  Hypokalemia - P.o. replacement, recheck K level tomorrow  HTN, uncontrolled - Secondary to noncoherent with BP meds - Start HCTZ plus lisinopril, sent prescription to [facility]  Morbid obesity - BMI> 35 - Calorie control recommended  Cardiomegaly, incidental finding - Outpatient echocardiogram  DVT prophylaxis: SCD Code Status: Full code Family Communication: None at bedside Disposition Plan: Expect less than 2 midnight hospital stay Consults called: Oncology Admission status: MedSurg observation   Cort ONEIDA Mana MD [facility] Pager [phone]  If 7PM-7AM, please contact night-coverage [email or web address] Password [facility]  02/18/2024, 2:33 PM

## 2024-02-18 NOTE — ED Provider Notes (Signed)
 [facility] Provider Note    Event Date/Time   First MD Initiated Contact with Patient 02/18/24 1113     (approximate)   History   Abdominal Pain and Shortness of Breath (/)   HPI  Melissa Johns is a 58 year old female presenting to the emergency department for evaluation of abdominal pain.  Reports she has had ongoing epigastric pain for several months with associated reflux.  Over the past 24 hours she has had worsened right upper quadrant and right flank pain.  Reports nausea without vomiting.  Additionally reports some shortness of breath.  No chest pain.  Negative home COVID and flu test.    Physical Exam   Triage Vital Signs: ED Triage Vitals  Encounter Vitals Group     BP 02/18/24 1010 (!) 160/69     Girls Systolic BP Percentile --      Girls Diastolic BP Percentile --      Boys Systolic BP Percentile --      Boys Diastolic BP Percentile --      Pulse Rate 02/18/24 1009 89     Resp 02/18/24 1009 20     Temp 02/18/24 1009 98 F (36.7 C)     Temp Source 02/18/24 1009 Oral     SpO2 02/18/24 1009 98 %     Weight --      Height --      Head Circumference --      Peak Flow --      Pain Score 02/18/24 1009 7     Pain Loc --      Pain Education --      Exclude from Growth Chart --     Most recent vital signs: Vitals:   02/18/24 1009 02/18/24 1010  BP:  (!) 160/69  Pulse: 89   Resp: 20   Temp: 98 F (36.7 C)   SpO2: 98%      General: Awake, interactive  CV:  Good peripheral perfusion Resp:  Unlabored respirations, lungs clear to auscultation Abd:  Nondistended, soft, tender to palpation in the right upper quadrant into the right lateral abdomen, remainder of abdomen nontender Neuro:  Symmetric facial movement, fluid speech   ED Results / Procedures / Treatments   Labs (all labs ordered are listed, but only abnormal results are displayed) Labs Reviewed  COMPREHENSIVE METABOLIC PANEL WITH GFR - Abnormal; Notable for the  following components:      Result Value   Potassium 3.3 (*)    Glucose, Bld 119 (*)    Albumin 3.4 (*)    AST 57 (*)    Alkaline Phosphatase 129 (*)    All other components within normal limits  CBC - Abnormal; Notable for the following components:   WBC 14.9 (*)    MCH 25.8 (*)    Platelets 414 (*)    All other components within normal limits  URINALYSIS, ROUTINE W REFLEX MICROSCOPIC - Abnormal; Notable for the following components:   Color, Urine AMBER (*)    APPearance HAZY (*)    Specific Gravity, Urine 1.034 (*)    Bilirubin Urine SMALL (*)    Protein, ur 100 (*)    Non Squamous Epithelial PRESENT (*)    All other components within normal limits  RESP PANEL BY RT-PCR (RSV, FLU A&B, COVID)  RVPGX2  LIPASE, BLOOD  CEA     EKG EKG independently reviewed and interpreted by myself demonstrates:  EKG demonstrates normal sinus rhythm at a rate  of 87, PR 180, QRS 132, QTc 488, findings of LVH noted, no STEMI  RADIOLOGY Imaging independently reviewed and interpreted by myself demonstrates:  CXR without focal consolidation CT abdomen pelvis concerning for metastatic disease with multiple liver lesions CTA PE study without obvious PE, concerning pulmonary nodules and adenopathy noted  Formal Radiology Read:  CT Angio Chest PE W and/or Wo Contrast Result Date: 02/18/2024 CLINICAL DATA:  Shortness of breath. Metastatic disease identified on abdomen and pelvis CT earlier today. * Tracking Code: BO * EXAM: CT ANGIOGRAPHY CHEST WITH CONTRAST TECHNIQUE: Multidetector CT imaging of the chest was performed using the standard protocol during bolus administration of intravenous contrast. Multiplanar CT image reconstructions and MIPs were obtained to evaluate the vascular anatomy. RADIATION DOSE REDUCTION: This exam was performed according to the departmental dose-optimization program which includes automated exposure control, adjustment of the mA and/or kV according to patient size and/or use  of iterative reconstruction technique. CONTRAST:  75mL OMNIPAQUE IOHEXOL 350 MG/ML SOLN COMPARISON:  None Available. FINDINGS: Cardiovascular: Heart size mildly enlarged. No substantial pericardial effusion. Mild atherosclerotic calcification is noted in the wall of the thoracic aorta. Enlargement of the pulmonary outflow tract/main pulmonary arteries suggests pulmonary arterial hypertension. No large central pulmonary embolus in the main or lobar pulmonary arteries. No definite segmental or subsegmental pulmonary embolus evident although assessment is limited by bolus timing and evaluation of these arteries may be unreliable. Mediastinum/Nodes: Bulky mediastinal lymphadenopathy including a 2.7 cm short axis right paratracheal lymph node and 2.5 cm short axis prevascular lymph node. There is bilateral hilar lymphadenopathy. The esophagus has normal imaging features. There is no axillary lymphadenopathy. Lungs/Pleura: Rr 18 mm parahilar right lung nodule identified on 65/6. 7 mm right lower lobe pulmonary nodule identified on 72/6. Upper Abdomen: See report for abdomen/pelvis CT performed immediately prior to this study and dictated separately. Musculoskeletal: No worrisome lytic or sclerotic osseous abnormality. Review of the MIP images confirms the above findings. IMPRESSION: 1. No large central pulmonary embolus in the main or lobar pulmonary arteries. No definite segmental or subsegmental pulmonary embolus evident although assessment is limited by bolus timing and evaluation of these arteries may be unreliable. 2. Bulky mediastinal and bilateral hilar lymphadenopathy. Imaging features are compatible with metastatic disease. 3. 18 mm parahilar right lung nodule with 7 mm right lower lobe pulmonary nodule. Imaging features are compatible with metastatic disease. 4. Enlargement of the pulmonary outflow tract/main pulmonary arteries suggests pulmonary arterial hypertension. 5.  Aortic Atherosclerosis (ICD10-I70.0).  Electronically Signed   By: Camellia Candle M.D.   On: 02/18/2024 13:50   CT ABDOMEN PELVIS W CONTRAST Result Date: 02/18/2024 CLINICAL DATA:  Shortness of breath. Diarrhea. Right flank pain. * Tracking Code: BO * EXAM: CT ABDOMEN AND PELVIS WITH CONTRAST TECHNIQUE: Multidetector CT imaging of the abdomen and pelvis was performed using the standard protocol following bolus administration of intravenous contrast. RADIATION DOSE REDUCTION: This exam was performed according to the departmental dose-optimization program which includes automated exposure control, adjustment of the mA and/or kV according to patient size and/or use of iterative reconstruction technique. CONTRAST:  100mL OMNIPAQUE IOHEXOL 300 MG/ML  SOLN COMPARISON:  None Available. FINDINGS: Lower chest: Mild cardiomegaly, without pericardial or pleural effusion. Marked lower thoracic adenopathy, with a node in the azygoesophageal recess measuring 2.5 x 3.3 cm on 01/02. Hepatobiliary: Hepatomegaly at 27.1 cm craniocaudal. Irregular hepatic capsule which is most likely due multiple hepatic masses. The dominant segment 4 B mass measures 10.1 x 9.4 cm on 35/2 and  causes upstream mild intrahepatic duct dilatation in segment 2 on image 28/2. Infiltrative right-sided masses including at 7.8 x 5.3 cm on 47/2. Normal gallbladder, without biliary ductal dilatation. Pancreas: Normal, without mass or ductal dilatation. Spleen: Normal in size, without focal abnormality. Adrenals/Urinary Tract: Left larger than right adrenal nodules. Example on the left at 3.3 cm and 8 HU. On the right at 1.8 cm and 15 HU. Upper pole right renal sinus cyst of 2.3 cm. Normal left kidney. No hydronephrosis. Normal urinary bladder. Stomach/Bowel: Normal stomach, without wall thickening. Eccentric right left rectal wall thickening on image 84/2. more significant eccentric right rectosigmoid wall thickening including on 76/2. Example 2.6 x 3.0 cm. Extensive colonic diverticulosis. Colonic  stool burden suggests constipation. Normal terminal ileum and appendix. Normal small bowel. Vascular/Lymphatic: Aortic atherosclerosis. Significant abdominal retroperitoneal adenopathy. Index gastrohepatic ligament node measures 2.2 cm on 21/2. No pelvic sidewall adenopathy. There is extensive adenopathy within the sigmoid mesocolon with a nodal mass measuring 3.8 x 4.2 cm on 70/2. Reproductive: Normal uterus and adnexa. Other: Trace perihepatic ascites. No evidence of omental or peritoneal disease. Musculoskeletal: No acute osseous abnormality. IMPRESSION: 1. Constellation of findings which are consistent with widespread metastatic disease. Massive lower thoracic, mild-to-moderate abdominal, and moderate pelvic adenopathy. Infiltrative bilateral hepatic masses. 2. The primary is most likely in the rectum or rectosigmoid colon, with areas of eccentric right wall thickening as detailed above. 3. Possible constipation. No evidence of high-grade bowel obstruction. 4. Left larger than right adrenal lesions. The left-sided lesion is consistent with an adenoma. The right-sided lesion is technically indeterminate but favored to represent an adenoma. 5. Segment 2 intrahepatic biliary duct dilatation secondary to the dominant segment 4A mass. Consider correlation with bilirubin levels. 6. Trace perihepatic ascites. 7. Incidental findings, including: Cardiomegaly. Aortic Atherosclerosis (ICD10-I70.0). Electronically Signed   By: Rockey Kilts M.D.   On: 02/18/2024 12:50   DG Chest 2 View Result Date: 02/18/2024 CLINICAL DATA:  Shortness of breath and epigastric pain. EXAM: CHEST - 2 VIEW COMPARISON:  None Available. FINDINGS: The heart size and mediastinal contours are within normal limits. No acute infiltrate, pleural effusion or pneumothorax identified. The visualized skeletal structures are unremarkable. IMPRESSION: No active cardiopulmonary disease. Electronically Signed   By: Suzen Dials M.D.   On: 02/18/2024  10:31    PROCEDURES:  Critical Care performed: Yes, see critical care procedure note(s)  CRITICAL CARE Performed by: Nilsa Dade   Total critical care time: 31 minutes  Critical care time was exclusive of separately billable procedures and treating other patients.  Critical care was necessary to treat or prevent imminent or life-threatening deterioration.  Critical care was time spent personally by me on the following activities: development of treatment plan with patient and/or surrogate as well as nursing, discussions with consultants, evaluation of patient's response to treatment, examination of patient, obtaining history from patient or surrogate, ordering and performing treatments and interventions, ordering and review of laboratory studies, ordering and review of radiographic studies, pulse oximetry and re-evaluation of patient's condition.   Procedures   MEDICATIONS ORDERED IN ED: Medications  ondansetron (ZOFRAN) injection 4 mg (4 mg Intravenous Given 02/18/24 1220)  morphine (PF) 4 MG/ML injection 4 mg (4 mg Intravenous Given 02/18/24 1220)  iohexol (OMNIPAQUE) 300 MG/ML solution 100 mL (100 mLs Intravenous Contrast Given 02/18/24 1200)  HYDROmorphone (DILAUDID) injection 0.5 mg (0.5 mg Intravenous Given 02/18/24 1345)  iohexol (OMNIPAQUE) 350 MG/ML injection 75 mL (75 mLs Intravenous Contrast Given 02/18/24 1322)  IMPRESSION / MDM / ASSESSMENT AND PLAN / ED COURSE  I reviewed the triage vital signs and the nursing notes.  Differential diagnosis includes, but is not limited to, biliary pathology, renal stone, appendicitis, other acute intra-abdominal process, pneumonia, anemia, electrolyte abnormality  Patient's presentation is most consistent with acute presentation with potential threat to life or bodily function.  58 year old female presenting to the emergency department for evaluation of abdominal pain over the last 24 hours as well as subacute epigastric pain and  shortness of breath.  Stable vitals on presentation.  Labs with leukocytosis with WC of 15, CMP without critical derangements.  Normal lipase.  Chest x-Arlayne Liggins without focal consolidation.  Will obtain CT to further evaluate and treat symptomatically with morphine and Zofran.  Clinical Course as of 02/18/24 1737  Sun Feb 18, 2024  1257 Reviewed CT findings, specifically radiology note of intrahepatic ductal dilatation with Dr. Maryruth. Normal bilirubin is reassuring, and given intra hepatic location, would not be amenable to intervention anyways.  No urgent intervention indicated. [NR]  1309 Patient updated on results of workup.  With her shortness of breath and now concerns for metastatic malignancy, will obtain CTA of the chest to further evaluate for PE.  Likely discussion with oncology following results of this to better determine disposition. [NR]  1415 CTA resulted without evidence of PE.  Case discussed with Dr. Melanee with oncology.  She notes that patient could be admitted for pain control with likely ability to arrange a liver biopsy for tomorrow.  However if patient prefers to be discharged home she can see the patient in clinic likely either tomorrow or Tuesday.  She does request that a CEA level be sent which has been ordered.  I discussed these options with the patient.  After discussing with her family members, she does prefer to be admitted overnight.  Will reach out to hospitalist team. [NR]  1424 Case discussed with Dr. Laurita with hospitalist team.  He will evaluate for anticipated admission. [NR]    Clinical Course User Index [NR] Levander Slate, MD     FINAL CLINICAL IMPRESSION(S) / ED DIAGNOSES   Final diagnoses:  Right flank pain  Rectal mass  Liver lesion  Pulmonary nodule     Rx / DC Orders   ED Discharge Orders     None        Note:  This document was prepared using Dragon voice recognition software and may include unintentional dictation errors.   Levander Slate,  MD 02/18/24 [phone]

## 2024-02-19 ENCOUNTER — Observation Stay

## 2024-02-19 DIAGNOSIS — G893 Neoplasm related pain (acute) (chronic): Secondary | ICD-10-CM | POA: Diagnosis not present

## 2024-02-19 DIAGNOSIS — C787 Secondary malignant neoplasm of liver and intrahepatic bile duct: Secondary | ICD-10-CM | POA: Diagnosis not present

## 2024-02-19 DIAGNOSIS — C7800 Secondary malignant neoplasm of unspecified lung: Secondary | ICD-10-CM | POA: Diagnosis not present

## 2024-02-19 LAB — CBC WITH DIFFERENTIAL/PLATELET
Abs Immature Granulocytes: 0.05 K/uL (ref 0.00–0.07)
Basophils Absolute: 0.1 K/uL (ref 0.0–0.1)
Basophils Relative: 0 %
Eosinophils Absolute: 0.1 K/uL (ref 0.0–0.5)
Eosinophils Relative: 1 %
HCT: 36.6 % (ref 36.0–46.0)
Hemoglobin: 11.4 g/dL — ABNORMAL LOW (ref 12.0–15.0)
Immature Granulocytes: 0 %
Lymphocytes Relative: 15 %
Lymphs Abs: 1.9 K/uL (ref 0.7–4.0)
MCH: 25.3 pg — ABNORMAL LOW (ref 26.0–34.0)
MCHC: 31.1 g/dL (ref 30.0–36.0)
MCV: 81.3 fL (ref 80.0–100.0)
Monocytes Absolute: 1.2 K/uL — ABNORMAL HIGH (ref 0.1–1.0)
Monocytes Relative: 10 %
Neutro Abs: 9 K/uL — ABNORMAL HIGH (ref 1.7–7.7)
Neutrophils Relative %: 74 %
Platelets: 431 K/uL — ABNORMAL HIGH (ref 150–400)
RBC: 4.5 MIL/uL (ref 3.87–5.11)
RDW: 14.6 % (ref 11.5–15.5)
WBC: 12.3 K/uL — ABNORMAL HIGH (ref 4.0–10.5)
nRBC: 0 % (ref 0.0–0.2)

## 2024-02-19 LAB — COMPREHENSIVE METABOLIC PANEL WITH GFR
ALT: 19 U/L (ref 0–44)
AST: 40 U/L (ref 15–41)
Albumin: 3.2 g/dL — ABNORMAL LOW (ref 3.5–5.0)
Alkaline Phosphatase: 108 U/L (ref 38–126)
Anion gap: 15 (ref 5–15)
BUN: 16 mg/dL (ref 6–20)
CO2: 27 mmol/L (ref 22–32)
Calcium: 9 mg/dL (ref 8.9–10.3)
Chloride: 94 mmol/L — ABNORMAL LOW (ref 98–111)
Creatinine, Ser: 0.67 mg/dL (ref 0.44–1.00)
GFR, Estimated: >60 mL/min (ref >60)
Glucose, Bld: 113 mg/dL — ABNORMAL HIGH (ref 70–99)
Potassium: 3.5 mmol/L (ref 3.5–5.1)
Sodium: 136 mmol/L (ref 135–145)
Total Bilirubin: 0.9 mg/dL (ref 0.0–1.2)
Total Protein: 7.6 g/dL (ref 6.5–8.1)

## 2024-02-19 LAB — HIV ANTIBODY (ROUTINE TESTING W REFLEX): HIV Screen 4th Generation wRfx: NONREACTIVE

## 2024-02-19 LAB — POTASSIUM: Potassium: 3.3 mmol/L — ABNORMAL LOW (ref 3.5–5.1)

## 2024-02-19 MED ORDER — FENTANYL CITRATE (PF) 100 MCG/2ML IJ SOLN
INTRAMUSCULAR | Status: AC
  Filled 2024-02-19: qty 4

## 2024-02-19 MED ORDER — MIDAZOLAM HCL 2 MG/2ML IJ SOLN
INTRAMUSCULAR | Status: AC
  Filled 2024-02-19: qty 4

## 2024-02-19 MED ORDER — MIDAZOLAM HCL (PF) 2 MG/2ML IJ SOLN
INTRAMUSCULAR | Status: AC | PRN
  Administered 2024-02-19: 1 mg via INTRAVENOUS

## 2024-02-19 MED ORDER — LIDOCAINE HCL (PF) 1 % IJ SOLN
10.0000 mL | Freq: Once | INTRAMUSCULAR | Status: AC
  Administered 2024-02-19: 10 mL via INTRADERMAL

## 2024-02-19 MED ORDER — OXYCODONE-ACETAMINOPHEN 5-325 MG PO TABS: 1.0000 | ORAL_TABLET | ORAL | 0 refills | Status: DC | PRN

## 2024-02-19 MED ORDER — POTASSIUM CHLORIDE 20 MEQ PO PACK
40.0000 meq | PACK | Freq: Once | ORAL | Status: AC
  Administered 2024-02-19: 40 meq via ORAL
  Filled 2024-02-19: qty 2

## 2024-02-19 MED ORDER — FENTANYL CITRATE (PF) 100 MCG/2ML IJ SOLN
INTRAMUSCULAR | Status: AC | PRN
  Administered 2024-02-19: 50 ug via INTRAVENOUS

## 2024-02-19 MED ORDER — HYDROMORPHONE HCL 1 MG/ML IJ SOLN
1.0000 mg | INTRAMUSCULAR | Status: DC | PRN
  Administered 2024-02-19 (×4): 1 mg via INTRAVENOUS
  Filled 2024-02-19 (×4): qty 1

## 2024-02-19 NOTE — Consult Note (Signed)
 Hematology/Oncology Consult note [facility] Telephone:([phone] [phone]  Patient Care Team: Patient, No Pcp Per as PCP - General (General Practice)   Name of the patient: Melissa Johns  [id number]  August 13, 1965    Reason for consult: Metastatic malignancy likely colorectal primary   Requesting physician: Dr. Leesa  Date of visit: 02/19/2024    History of presenting illness-patient is a 58 year old female with a past medical history significant for hypertension who presented with symptoms of epigastric and right upper quadrant abdominal pain which has been ongoing for 2 weeks.She underwent CT abdomen pelvis with contrast in the ER which showed significant thoracic adenopathy, multiple hepatic masses.  Left greater than right adrenal nodules.  There was evidence of intra-abdominal adenopathy including index gastrohepatic lymph node measuring 2.2 cm.  Extensive adenopathy within the sigmoid mesocolon with a nodal mass of 3.8 x 4.2 cm.  Findings concerning for rectosigmoid primary given the eccentric right wall thickening.  CT angio chest did not show any evidence of large central pulmonary embolus or subsegmental pulmonary embolism.  Bulky mediastinal and bilateral hilar adenopathy  Patient lives in [place] with her husband.  She works 2 jobs and is independent of her ADLs and IADLs.  Patient reports improvement in her abdominal pain after she was given Dilaudid in the hospital.  Denies any blood in her stool or significant changes in her appetite.  ECOG PS- 1  Pain scale- 4   Review of systems- Review of Systems  Constitutional:  Negative for chills, fever, malaise/fatigue and weight loss.  HENT:  Negative for congestion, ear discharge and nosebleeds.   Eyes:  Negative for blurred vision.  Respiratory:  Negative for cough, hemoptysis, sputum production, shortness of breath and wheezing.   Cardiovascular:  Negative for chest pain, palpitations,  orthopnea and claudication.  Gastrointestinal:  Positive for abdominal pain. Negative for blood in stool, constipation, diarrhea, heartburn, melena, nausea and vomiting.  Genitourinary:  Negative for dysuria, flank pain, frequency, hematuria and urgency.  Musculoskeletal:  Negative for back pain, joint pain and myalgias.  Skin:  Negative for rash.  Neurological:  Negative for dizziness, tingling, focal weakness, seizures, weakness and headaches.  Endo/Heme/Allergies:  Does not bruise/bleed easily.  Psychiatric/Behavioral:  Negative for depression and suicidal ideas. The patient does not have insomnia.     Allergies  Allergen Reactions   Penicillins Other (See Comments)    joints lock up  Joints lock becomes immobile, joint pain    Patient Active Problem List   Diagnosis Date Noted   Abdominal pain 02/18/2024   Colorectal cancer, stage IV (HCC) 02/18/2024     History reviewed. No pertinent past medical history.   History reviewed. No pertinent surgical history.  Social History   Socioeconomic History   Marital status: Married    Spouse name: Not on file   Number of children: Not on file   Years of education: Not on file   Highest education level: Not on file  Occupational History   Not on file  Tobacco Use   Smoking status: Never   Smokeless tobacco: Never  Substance and Sexual Activity   Alcohol use: No   Drug use: No   Sexual activity: Not on file  Other Topics Concern   Not on file  Social History Narrative   Not on file   Social Drivers of Health   Financial Resource Strain: Not on file  Food Insecurity: No Food Insecurity (02/18/2024)   Hunger Vital Sign  Worried About Programme researcher, broadcasting/film/video in the Last Year: Never true    Ran Out of Food in the Last Year: Never true  Transportation Needs: No Transportation Needs (02/18/2024)   PRAPARE - Administrator, Civil Service (Medical): No    Lack of Transportation (Non-Medical): No  Physical  Activity: Not on file  Stress: Not on file  Social Connections: Not on file  Intimate Partner Violence: Not At Risk (02/18/2024)   Humiliation, Afraid, Rape, and Kick questionnaire    Fear of Current or Ex-Partner: No    Emotionally Abused: No    Physically Abused: No    Sexually Abused: No     Family History  Problem Relation Age of Onset   Breast cancer Sister 50     Current Facility-Administered Medications:    acetaminophen (TYLENOL) tablet 650 mg, 650 mg, Oral, Q6H PRN **OR** acetaminophen (TYLENOL) suppository 650 mg, 650 mg, Rectal, Q6H PRN, Laurita Cort DASEN, MD   bisacodyl (DULCOLAX) EC tablet 5 mg, 5 mg, Oral, Daily PRN, Zhang, Ping T, MD   hydrochlorothiazide (HYDRODIURIL) tablet 12.5 mg, 12.5 mg, Oral, Daily, Zhang, Ping T, MD, 12.5 mg at 02/18/24 1639   HYDROmorphone (DILAUDID) injection 1 mg, 1 mg, Intravenous, Q4H PRN, Mansy, Jan A, MD, 1 mg at 02/19/24 1457   lisinopril (ZESTRIL) tablet 20 mg, 20 mg, Oral, Daily, Zhang, Ping T, MD, 20 mg at 02/18/24 1639   ondansetron (ZOFRAN) tablet 4 mg, 4 mg, Oral, Q6H PRN **OR** ondansetron (ZOFRAN) injection 4 mg, 4 mg, Intravenous, Q6H PRN, Zhang, Ping T, MD   oxyCODONE (Oxy IR/ROXICODONE) immediate release tablet 5 mg, 5 mg, Oral, Q4H PRN, Zhang, Ping T, MD, 5 mg at 02/18/24 2234   senna-docusate (Senokot-S) tablet 1 tablet, 1 tablet, Oral, QHS PRN, Laurita Cort DASEN, MD  Current Outpatient Medications:    lisinopril-hydrochlorothiazide (ZESTORETIC) 20-12.5 MG tablet, Take 1 tablet by mouth daily., Disp: 30 tablet, Rfl: 2   oxyCODONE-acetaminophen (PERCOCET) 5-325 MG tablet, Take 1 tablet by mouth every 4 (four) hours as needed for up to 5 days for severe pain (pain score 7-10) (for pain not relieved by tylenol or ibuprofen alone)., Disp: 20 tablet, Rfl: 0   Physical exam:  Vitals:   02/19/24 1245 02/19/24 1250 02/19/24 1305 02/19/24 1320  BP: (!) 107/50 (!) 99/55 (!) 107/52 (!) 119/91  Pulse: 60 62 63 72  Resp: 11 17 16 16   Temp:       TempSrc:      SpO2: 98% 96% 95% 92%  Weight:      Height:       Physical Exam Cardiovascular:     Rate and Rhythm: Normal rate and regular rhythm.     Heart sounds: Normal heart sounds.  Pulmonary:     Effort: Pulmonary effort is normal.     Breath sounds: Normal breath sounds.  Abdominal:     General: Bowel sounds are normal.     Palpations: Abdomen is soft.  Skin:    General: Skin is warm and dry.  Neurological:     Mental Status: She is alert and oriented to person, place, and time.           Latest Ref Rng & Units 02/19/2024    9:16 AM  CMP  Glucose 70 - 99 mg/dL 886   BUN 6 - 20 mg/dL 16   Creatinine 9.55 - 1.00 mg/dL 9.32   Sodium 864 - 854 mmol/L 136   Potassium 3.5 -  5.1 mmol/L 3.5   Chloride 98 - 111 mmol/L 94   CO2 22 - 32 mmol/L 27   Calcium 8.9 - 10.3 mg/dL 9.0   Total Protein 6.5 - 8.1 g/dL 7.6   Total Bilirubin 0.0 - 1.2 mg/dL 0.9   Alkaline Phos 38 - 126 U/L 108   AST 15 - 41 U/L 40   ALT 0 - 44 U/L 19       Latest Ref Rng & Units 02/19/2024    9:16 AM  CBC  WBC 4.0 - 10.5 K/uL 12.3   Hemoglobin 12.0 - 15.0 g/dL 88.5   Hematocrit 63.9 - 46.0 % 36.6   Platelets 150 - 400 K/uL 431     @IMAGES @  US  BIOPSY (LIVER) Result Date: 02/19/2024 INDICATION: Masses of liver and lymphadenopathy and pulmonary nodules in the chest. The patient presents for liver biopsy. EXAM: ULTRASOUND GUIDED CORE BIOPSY OF LIVER MEDICATIONS: None. ANESTHESIA/SEDATION: Moderate (conscious) sedation was employed during this procedure. A total of Versed 1.0 mg and Fentanyl 50 mcg was administered intravenously. Moderate Sedation Time: 13 minutes. The patient's level of consciousness and vital signs were monitored continuously by radiology nursing throughout the procedure under my direct supervision. PROCEDURE: The procedure, risks, benefits, and alternatives were explained to the patient. Questions regarding the procedure were encouraged and answered. The patient  understands and consents to the procedure. A time out was performed prior to initiating the procedure. The abdominal wall was prepped with chlorhexidine in a sterile fashion, and a sterile drape was applied covering the operative field. A sterile gown and sterile gloves were used for the procedure. Local anesthesia was provided with 1% Lidocaine. Ultrasound was performed of the liver to localize lesions. A site was chosen over the anterior abdominal wall for biopsy. Under ultrasound guidance, a 17 gauge trocar needle was advanced into the left lobe of the liver. After confirming needle tip position, 3 separate coaxial 18 gauge core biopsy samples were obtained of a left lobe liver lesion. Samples were submitted in formalin. A slurry of Gel-Foam EmboCubes mixed with sterile saline was then injected as the outer needle was retracted and removed. Additional ultrasound was performed COMPLICATIONS: None immediate. FINDINGS: Large ill-defined hyperechoic mass in the left lobe of the liver measuring roughly 12 cm and smaller right lobe mass were localized by ultrasound. The left lobe lesion was chosen for biopsy. Solid tissue was obtained. IMPRESSION: Ultrasound-guided core biopsy performed of a large mass in the left lobe of the liver measuring up to roughly 12 cm. Electronically Signed   By: Marcey Moan M.D.   On: 02/19/2024 13:09   CT Angio Chest PE W and/or Wo Contrast Result Date: 02/18/2024 CLINICAL DATA:  Shortness of breath. Metastatic disease identified on abdomen and pelvis CT earlier today. * Tracking Code: BO * EXAM: CT ANGIOGRAPHY CHEST WITH CONTRAST TECHNIQUE: Multidetector CT imaging of the chest was performed using the standard protocol during bolus administration of intravenous contrast. Multiplanar CT image reconstructions and MIPs were obtained to evaluate the vascular anatomy. RADIATION DOSE REDUCTION: This exam was performed according to the departmental dose-optimization program which includes  automated exposure control, adjustment of the mA and/or kV according to patient size and/or use of iterative reconstruction technique. CONTRAST:  75mL OMNIPAQUE IOHEXOL 350 MG/ML SOLN COMPARISON:  None Available. FINDINGS: Cardiovascular: Heart size mildly enlarged. No substantial pericardial effusion. Mild atherosclerotic calcification is noted in the wall of the thoracic aorta. Enlargement of the pulmonary outflow tract/main pulmonary arteries suggests pulmonary arterial  hypertension. No large central pulmonary embolus in the main or lobar pulmonary arteries. No definite segmental or subsegmental pulmonary embolus evident although assessment is limited by bolus timing and evaluation of these arteries may be unreliable. Mediastinum/Nodes: Bulky mediastinal lymphadenopathy including a 2.7 cm short axis right paratracheal lymph node and 2.5 cm short axis prevascular lymph node. There is bilateral hilar lymphadenopathy. The esophagus has normal imaging features. There is no axillary lymphadenopathy. Lungs/Pleura: Rr 18 mm parahilar right lung nodule identified on 65/6. 7 mm right lower lobe pulmonary nodule identified on 72/6. Upper Abdomen: See report for abdomen/pelvis CT performed immediately prior to this study and dictated separately. Musculoskeletal: No worrisome lytic or sclerotic osseous abnormality. Review of the MIP images confirms the above findings. IMPRESSION: 1. No large central pulmonary embolus in the main or lobar pulmonary arteries. No definite segmental or subsegmental pulmonary embolus evident although assessment is limited by bolus timing and evaluation of these arteries may be unreliable. 2. Bulky mediastinal and bilateral hilar lymphadenopathy. Imaging features are compatible with metastatic disease. 3. 18 mm parahilar right lung nodule with 7 mm right lower lobe pulmonary nodule. Imaging features are compatible with metastatic disease. 4. Enlargement of the pulmonary outflow tract/main  pulmonary arteries suggests pulmonary arterial hypertension. 5.  Aortic Atherosclerosis (ICD10-I70.0). Electronically Signed   By: Camellia Candle M.D.   On: 02/18/2024 13:50   CT ABDOMEN PELVIS W CONTRAST Result Date: 02/18/2024 CLINICAL DATA:  Shortness of breath. Diarrhea. Right flank pain. * Tracking Code: BO * EXAM: CT ABDOMEN AND PELVIS WITH CONTRAST TECHNIQUE: Multidetector CT imaging of the abdomen and pelvis was performed using the standard protocol following bolus administration of intravenous contrast. RADIATION DOSE REDUCTION: This exam was performed according to the departmental dose-optimization program which includes automated exposure control, adjustment of the mA and/or kV according to patient size and/or use of iterative reconstruction technique. CONTRAST:  100mL OMNIPAQUE IOHEXOL 300 MG/ML  SOLN COMPARISON:  None Available. FINDINGS: Lower chest: Mild cardiomegaly, without pericardial or pleural effusion. Marked lower thoracic adenopathy, with a node in the azygoesophageal recess measuring 2.5 x 3.3 cm on 01/02. Hepatobiliary: Hepatomegaly at 27.1 cm craniocaudal. Irregular hepatic capsule which is most likely due multiple hepatic masses. The dominant segment 4 B mass measures 10.1 x 9.4 cm on 35/2 and causes upstream mild intrahepatic duct dilatation in segment 2 on image 28/2. Infiltrative right-sided masses including at 7.8 x 5.3 cm on 47/2. Normal gallbladder, without biliary ductal dilatation. Pancreas: Normal, without mass or ductal dilatation. Spleen: Normal in size, without focal abnormality. Adrenals/Urinary Tract: Left larger than right adrenal nodules. Example on the left at 3.3 cm and 8 HU. On the right at 1.8 cm and 15 HU. Upper pole right renal sinus cyst of 2.3 cm. Normal left kidney. No hydronephrosis. Normal urinary bladder. Stomach/Bowel: Normal stomach, without wall thickening. Eccentric right left rectal wall thickening on image 84/2. more significant eccentric right  rectosigmoid wall thickening including on 76/2. Example 2.6 x 3.0 cm. Extensive colonic diverticulosis. Colonic stool burden suggests constipation. Normal terminal ileum and appendix. Normal small bowel. Vascular/Lymphatic: Aortic atherosclerosis. Significant abdominal retroperitoneal adenopathy. Index gastrohepatic ligament node measures 2.2 cm on 21/2. No pelvic sidewall adenopathy. There is extensive adenopathy within the sigmoid mesocolon with a nodal mass measuring 3.8 x 4.2 cm on 70/2. Reproductive: Normal uterus and adnexa. Other: Trace perihepatic ascites. No evidence of omental or peritoneal disease. Musculoskeletal: No acute osseous abnormality. IMPRESSION: 1. Constellation of findings which are consistent with widespread metastatic disease. Massive  lower thoracic, mild-to-moderate abdominal, and moderate pelvic adenopathy. Infiltrative bilateral hepatic masses. 2. The primary is most likely in the rectum or rectosigmoid colon, with areas of eccentric right wall thickening as detailed above. 3. Possible constipation. No evidence of high-grade bowel obstruction. 4. Left larger than right adrenal lesions. The left-sided lesion is consistent with an adenoma. The right-sided lesion is technically indeterminate but favored to represent an adenoma. 5. Segment 2 intrahepatic biliary duct dilatation secondary to the dominant segment 4A mass. Consider correlation with bilirubin levels. 6. Trace perihepatic ascites. 7. Incidental findings, including: Cardiomegaly. Aortic Atherosclerosis (ICD10-I70.0). Electronically Signed   By: Rockey Kilts M.D.   On: 02/18/2024 12:50   DG Chest 2 View Result Date: 02/18/2024 CLINICAL DATA:  Shortness of breath and epigastric pain. EXAM: CHEST - 2 VIEW COMPARISON:  None Available. FINDINGS: The heart size and mediastinal contours are within normal limits. No acute infiltrate, pleural effusion or pneumothorax identified. The visualized skeletal structures are unremarkable.  IMPRESSION: No active cardiopulmonary disease. Electronically Signed   By: Suzen Dials M.D.   On: 02/18/2024 10:31    Assessment and plan- Patient is a 58 y.o. female admitted for right upper quadrant abdominal pain found to have extensive metastatic disease possibly colorectal primary  I have reviewed CT abdomen pelvis images independently and discussed findings with the patient.  I have also reviewed CT angiogram images which shows extensive thoracic and intra-abdominal adenopathy.  Presence of multiple hepatic masses as well as adrenal masses concerning for metastatic malignancy.  There was eccentric wall thickening noted in the rectum possibly suggestive of the primary.  Plan is for patient to get ultrasound-guided liver biopsy today.  CEA will be checked today.  Following this patient can be discharged and I will follow-up with her as an outpatient to discuss pathology results and further management.  Discussed with the patient and daughter that treatment plan will depend on final biopsy results.  Overall this constitutes stage IV disease and there would be no role for definitive surgery or radiation treatment at this time.  Treatment will be palliative and not curative.  Prognosis will depend on pathology results as well.  Neoplasm related pain: Patient can be discharged on as needed Dilaudid and I will subsequently manage her pain as an outpatient   Thank you for this kind referral and the opportunity to participate in the care of this patient   Visit Diagnosis 1. Right flank pain   2. Rectal mass   3. Liver lesion   4. Pulmonary nodule     Dr. Annah Skene, MD, MPH [facility] at [facility] [phone] 02/19/2024 4:33 PM

## 2024-02-19 NOTE — Discharge Summary (Signed)
 DISCHARGE SUMMARY    Melissa Johns FMW:[id number] DOB: [birth year] DOA: 02/18/2024  PCP: Patient, No Pcp Per  Admit date: 02/18/2024 Discharge date: 02/19/2024   Recommendations for Outpatient Follow-up:  Follow up with PCP in 1-2 weeks to manage chronic conditions including hypertension and refer for echocardiogram Keep oncology appointment as scheduled on 10/24  Hospital Course: Melissa Johns 58 year old female with hypertension who presented to the ED complaining of abdominal pain, and intermittent diarrhea for the last 2 weeks.  CT abdomen pelvis revealed metastatic malignancy, primary lesion presumed colorectal.  CTA was negative for PE but did reveal mediastinal adenopathy as well as scattered lung metastasis.  She was admitted and oncology was consulted.  Interventional radiology took the patient for liver biopsy on 10/20.  She will follow-up with oncology for pathology results.  Metastatic malignancy, unknown primary - Biopsy by IR 10/20. - Presumed colorectal.  Metastatic lesions to mediastinum, liver, lungs, diffuse adenopathy. - Oncology consulted and has established outpatient appointment on 10/24 - Percocet given at DC for pain control not controlled by Tylenol or ibuprofen  Hypokalemia - Replace as needed  Hypertension, uncontrolled - Patient meant to be on HCTZ-lisinopril but ran out of refills.  Rx sent to pharmacy.  Morbid obesity BMI 46 - Outpatient follow up for lifestyle modification and risk factor management  Cardiomegaly - Heart mildly enlarged incidentally noted on CT.  Will refer to PCP for echocardiogram at DC  Discharge Instructions  Discharge Instructions     Call MD for:  difficulty breathing, headache or visual disturbances   Complete by: As directed    Call MD for:  persistant dizziness or light-headedness   Complete by: As directed    Call MD for:  persistant nausea and vomiting   Complete by: As directed    Call MD for:  severe  uncontrolled pain   Complete by: As directed    Call MD for:  temperature >100.4   Complete by: As directed    Diet general   Complete by: As directed    Increase activity slowly   Complete by: As directed    No wound care   Complete by: As directed       Allergies as of 02/19/2024       Reactions   Penicillins Other (See Comments)   joints lock up Joints lock becomes immobile, joint pain        Medication List     STOP taking these medications    methylPREDNISolone  4 MG Tbpk tablet Commonly known as: MEDROL  DOSEPAK   traMADol  50 MG tablet Commonly known as: ULTRAM        TAKE these medications    lisinopril-hydrochlorothiazide 20-12.5 MG tablet Commonly known as: Zestoretic Take 1 tablet by mouth daily.   oxyCODONE-acetaminophen 5-325 MG tablet Commonly known as: Percocet Take 1 tablet by mouth every 4 (four) hours as needed for up to 5 days for severe pain (pain score 7-10) (for pain not relieved by tylenol or ibuprofen alone).        Allergies  Allergen Reactions   Penicillins Other (See Comments)    joints lock up  Joints lock becomes immobile, joint pain    Consultations: Treatment Team:  Melanee Annah BROCKS, MD   Procedures/Studies: US  BIOPSY (LIVER) Result Date: 02/19/2024 INDICATION: Masses of liver and lymphadenopathy and pulmonary nodules in the chest. The patient presents for liver biopsy. EXAM: ULTRASOUND GUIDED CORE BIOPSY OF LIVER MEDICATIONS: None. ANESTHESIA/SEDATION: Moderate (conscious) sedation was employed during this  procedure. A total of Versed 1.0 mg and Fentanyl 50 mcg was administered intravenously. Moderate Sedation Time: 13 minutes. The patient's level of consciousness and vital signs were monitored continuously by radiology nursing throughout the procedure under my direct supervision. PROCEDURE: The procedure, risks, benefits, and alternatives were explained to the patient. Questions regarding the procedure were encouraged  and answered. The patient understands and consents to the procedure. A time out was performed prior to initiating the procedure. The abdominal wall was prepped with chlorhexidine in a sterile fashion, and a sterile drape was applied covering the operative field. A sterile gown and sterile gloves were used for the procedure. Local anesthesia was provided with 1% Lidocaine. Ultrasound was performed of the liver to localize lesions. A site was chosen over the anterior abdominal wall for biopsy. Under ultrasound guidance, a 17 gauge trocar needle was advanced into the left lobe of the liver. After confirming needle tip position, 3 separate coaxial 18 gauge core biopsy samples were obtained of a left lobe liver lesion. Samples were submitted in formalin. A slurry of Gel-Foam EmboCubes mixed with sterile saline was then injected as the outer needle was retracted and removed. Additional ultrasound was performed COMPLICATIONS: None immediate. FINDINGS: Large ill-defined hyperechoic mass in the left lobe of the liver measuring roughly 12 cm and smaller right lobe mass were localized by ultrasound. The left lobe lesion was chosen for biopsy. Solid tissue was obtained. IMPRESSION: Ultrasound-guided core biopsy performed of a large mass in the left lobe of the liver measuring up to roughly 12 cm. Electronically Signed   By: Marcey Moan M.D.   On: 02/19/2024 13:09   CT Angio Chest PE W and/or Wo Contrast Result Date: 02/18/2024 CLINICAL DATA:  Shortness of breath. Metastatic disease identified on abdomen and pelvis CT earlier today. * Tracking Code: BO * EXAM: CT ANGIOGRAPHY CHEST WITH CONTRAST TECHNIQUE: Multidetector CT imaging of the chest was performed using the standard protocol during bolus administration of intravenous contrast. Multiplanar CT image reconstructions and MIPs were obtained to evaluate the vascular anatomy. RADIATION DOSE REDUCTION: This exam was performed according to the departmental  dose-optimization program which includes automated exposure control, adjustment of the mA and/or kV according to patient size and/or use of iterative reconstruction technique. CONTRAST:  75mL OMNIPAQUE IOHEXOL 350 MG/ML SOLN COMPARISON:  None Available. FINDINGS: Cardiovascular: Heart size mildly enlarged. No substantial pericardial effusion. Mild atherosclerotic calcification is noted in the wall of the thoracic aorta. Enlargement of the pulmonary outflow tract/main pulmonary arteries suggests pulmonary arterial hypertension. No large central pulmonary embolus in the main or lobar pulmonary arteries. No definite segmental or subsegmental pulmonary embolus evident although assessment is limited by bolus timing and evaluation of these arteries may be unreliable. Mediastinum/Nodes: Bulky mediastinal lymphadenopathy including a 2.7 cm short axis right paratracheal lymph node and 2.5 cm short axis prevascular lymph node. There is bilateral hilar lymphadenopathy. The esophagus has normal imaging features. There is no axillary lymphadenopathy. Lungs/Pleura: Rr 18 mm parahilar right lung nodule identified on 65/6. 7 mm right lower lobe pulmonary nodule identified on 72/6. Upper Abdomen: See report for abdomen/pelvis CT performed immediately prior to this study and dictated separately. Musculoskeletal: No worrisome lytic or sclerotic osseous abnormality. Review of the MIP images confirms the above findings. IMPRESSION: 1. No large central pulmonary embolus in the main or lobar pulmonary arteries. No definite segmental or subsegmental pulmonary embolus evident although assessment is limited by bolus timing and evaluation of these arteries may be unreliable. 2. Bulky  mediastinal and bilateral hilar lymphadenopathy. Imaging features are compatible with metastatic disease. 3. 18 mm parahilar right lung nodule with 7 mm right lower lobe pulmonary nodule. Imaging features are compatible with metastatic disease. 4. Enlargement of  the pulmonary outflow tract/main pulmonary arteries suggests pulmonary arterial hypertension. 5.  Aortic Atherosclerosis (ICD10-I70.0). Electronically Signed   By: Camellia Candle M.D.   On: 02/18/2024 13:50   CT ABDOMEN PELVIS W CONTRAST Result Date: 02/18/2024 CLINICAL DATA:  Shortness of breath. Diarrhea. Right flank pain. * Tracking Code: BO * EXAM: CT ABDOMEN AND PELVIS WITH CONTRAST TECHNIQUE: Multidetector CT imaging of the abdomen and pelvis was performed using the standard protocol following bolus administration of intravenous contrast. RADIATION DOSE REDUCTION: This exam was performed according to the departmental dose-optimization program which includes automated exposure control, adjustment of the mA and/or kV according to patient size and/or use of iterative reconstruction technique. CONTRAST:  100mL OMNIPAQUE IOHEXOL 300 MG/ML  SOLN COMPARISON:  None Available. FINDINGS: Lower chest: Mild cardiomegaly, without pericardial or pleural effusion. Marked lower thoracic adenopathy, with a node in the azygoesophageal recess measuring 2.5 x 3.3 cm on 01/02. Hepatobiliary: Hepatomegaly at 27.1 cm craniocaudal. Irregular hepatic capsule which is most likely due multiple hepatic masses. The dominant segment 4 B mass measures 10.1 x 9.4 cm on 35/2 and causes upstream mild intrahepatic duct dilatation in segment 2 on image 28/2. Infiltrative right-sided masses including at 7.8 x 5.3 cm on 47/2. Normal gallbladder, without biliary ductal dilatation. Pancreas: Normal, without mass or ductal dilatation. Spleen: Normal in size, without focal abnormality. Adrenals/Urinary Tract: Left larger than right adrenal nodules. Example on the left at 3.3 cm and 8 HU. On the right at 1.8 cm and 15 HU. Upper pole right renal sinus cyst of 2.3 cm. Normal left kidney. No hydronephrosis. Normal urinary bladder. Stomach/Bowel: Normal stomach, without wall thickening. Eccentric right left rectal wall thickening on image 84/2. more  significant eccentric right rectosigmoid wall thickening including on 76/2. Example 2.6 x 3.0 cm. Extensive colonic diverticulosis. Colonic stool burden suggests constipation. Normal terminal ileum and appendix. Normal small bowel. Vascular/Lymphatic: Aortic atherosclerosis. Significant abdominal retroperitoneal adenopathy. Index gastrohepatic ligament node measures 2.2 cm on 21/2. No pelvic sidewall adenopathy. There is extensive adenopathy within the sigmoid mesocolon with a nodal mass measuring 3.8 x 4.2 cm on 70/2. Reproductive: Normal uterus and adnexa. Other: Trace perihepatic ascites. No evidence of omental or peritoneal disease. Musculoskeletal: No acute osseous abnormality. IMPRESSION: 1. Constellation of findings which are consistent with widespread metastatic disease. Massive lower thoracic, mild-to-moderate abdominal, and moderate pelvic adenopathy. Infiltrative bilateral hepatic masses. 2. The primary is most likely in the rectum or rectosigmoid colon, with areas of eccentric right wall thickening as detailed above. 3. Possible constipation. No evidence of high-grade bowel obstruction. 4. Left larger than right adrenal lesions. The left-sided lesion is consistent with an adenoma. The right-sided lesion is technically indeterminate but favored to represent an adenoma. 5. Segment 2 intrahepatic biliary duct dilatation secondary to the dominant segment 4A mass. Consider correlation with bilirubin levels. 6. Trace perihepatic ascites. 7. Incidental findings, including: Cardiomegaly. Aortic Atherosclerosis (ICD10-I70.0). Electronically Signed   By: Rockey Kilts M.D.   On: 02/18/2024 12:50   DG Chest 2 View Result Date: 02/18/2024 CLINICAL DATA:  Shortness of breath and epigastric pain. EXAM: CHEST - 2 VIEW COMPARISON:  None Available. FINDINGS: The heart size and mediastinal contours are within normal limits. No acute infiltrate, pleural effusion or pneumothorax identified. The visualized skeletal  structures are  unremarkable. IMPRESSION: No active cardiopulmonary disease. Electronically Signed   By: Suzen Dials M.D.   On: 02/18/2024 10:31      Discharge Exam: Vitals:   02/19/24 1305 02/19/24 1320  BP: (!) 107/52 (!) 119/91  Pulse: 63 72  Resp: 16 16  Temp:    SpO2: 95% 92%   Vitals:   02/19/24 1245 02/19/24 1250 02/19/24 1305 02/19/24 1320  BP: (!) 107/50 (!) 99/55 (!) 107/52 (!) 119/91  Pulse: 60 62 63 72  Resp: 11 17 16 16   Temp:      TempSrc:      SpO2: 98% 96% 95% 92%  Weight:      Height:        Constitutional:  Normal appearance. Non toxic-appearing.  HENT: Head Normocephalic and atraumatic.  Mucous membranes are moist.  Eyes:  Extraocular intact. Conjunctivae normal.  Cardiovascular: Rate and Rhythm: Normal rate and regular rhythm. Abdomen: Obese, nontender to palpation, mild tenderness in the left flank and epigastrium  Pulmonary: Non labored, symmetric rise of chest wall.  Skin: warm and dry. not jaundiced.  Neurological: No focal deficit present. alert. Oriented.  Psychiatric: Mood and Affect congruent.    The results of significant diagnostics from this hospitalization (including imaging, microbiology, ancillary and laboratory) are listed below for reference.     Microbiology: Recent Results (from the past 240 hours)  Resp panel by RT-PCR (RSV, Flu A&B, Covid) Anterior Nasal Swab     Status: None   Collection Time: 02/18/24 11:21 AM   Specimen: Anterior Nasal Swab  Result Value Ref Range Status   SARS Coronavirus 2 by RT PCR NEGATIVE NEGATIVE Final    Comment: (NOTE) SARS-CoV-2 target nucleic acids are NOT DETECTED.  The SARS-CoV-2 RNA is generally detectable in upper respiratory specimens during the acute phase of infection. The lowest concentration of SARS-CoV-2 viral copies this assay can detect is 138 copies/mL. A negative result does not preclude SARS-Cov-2 infection and should not be used as the sole basis for treatment or other  patient management decisions. A negative result may occur with  improper specimen collection/handling, submission of specimen other than nasopharyngeal swab, presence of viral mutation(s) within the areas targeted by this assay, and inadequate number of viral copies(<138 copies/mL). A negative result must be combined with clinical observations, patient history, and epidemiological information. The expected result is Negative.  Fact Sheet for Patients:  BloggerCourse.com  Fact Sheet for Healthcare Providers:  SeriousBroker.it  This test is no t yet approved or cleared by the [place]  FDA and  has been authorized for detection and/or diagnosis of SARS-CoV-2 by FDA under an Emergency Use Authorization (EUA). This EUA will remain  in effect (meaning this test can be used) for the duration of the COVID-19 declaration under Section 564(b)(1) of the Act, 21 U.S.C.section 360bbb-3(b)(1), unless the authorization is terminated  or revoked sooner.       Influenza A by PCR NEGATIVE NEGATIVE Final   Influenza B by PCR NEGATIVE NEGATIVE Final    Comment: (NOTE) The Xpert Xpress SARS-CoV-2/FLU/RSV plus assay is intended as an aid in the diagnosis of influenza from Nasopharyngeal swab specimens and should not be used as a sole basis for treatment. Nasal washings and aspirates are unacceptable for Xpert Xpress SARS-CoV-2/FLU/RSV testing.  Fact Sheet for Patients: BloggerCourse.com  Fact Sheet for Healthcare Providers: SeriousBroker.it  This test is not yet approved or cleared by the [place]  FDA and has been authorized for detection and/or diagnosis of SARS-CoV-2 by FDA  under an Emergency Use Authorization (EUA). This EUA will remain in effect (meaning this test can be used) for the duration of the COVID-19 declaration under Section 564(b)(1) of the Act, 21 U.S.C. section  360bbb-3(b)(1), unless the authorization is terminated or revoked.     Resp Syncytial Virus by PCR NEGATIVE NEGATIVE Final    Comment: (NOTE) Fact Sheet for Patients: BloggerCourse.com  Fact Sheet for Healthcare Providers: SeriousBroker.it  This test is not yet approved or cleared by the [place]  FDA and has been authorized for detection and/or diagnosis of SARS-CoV-2 by FDA under an Emergency Use Authorization (EUA). This EUA will remain in effect (meaning this test can be used) for the duration of the COVID-19 declaration under Section 564(b)(1) of the Act, 21 U.S.C. section 360bbb-3(b)(1), unless the authorization is terminated or revoked.  Performed at [facility] [address] [place], [place] [address]      Labs: BNP (last 3 results) No results for input(s): BNP in the last 8760 hours. Basic Metabolic Panel: Recent Labs  Lab 02/18/24 1011 02/19/24 0434 02/19/24 0916  NA 136  --  136  K 3.3* 3.3* 3.5  CL 98  --  94*  CO2 25  --  27  GLUCOSE 119*  --  113*  BUN 17  --  16  CREATININE 0.46  --  0.67  CALCIUM 9.2  --  9.0  MG 2.2  --   --    Liver Function Tests: Recent Labs  Lab 02/18/24 1011 02/19/24 0916  AST 57* 40  ALT 22 19  ALKPHOS 129* 108  BILITOT 0.9 0.9  PROT 7.7 7.6  ALBUMIN 3.4* 3.2*   Recent Labs  Lab 02/18/24 1011  LIPASE 21   No results for input(s): AMMONIA in the last 168 hours. CBC: Recent Labs  Lab 02/18/24 1011 02/19/24 0916  WBC 14.9* 12.3*  NEUTROABS  --  9.0*  HGB 12.4 11.4*  HCT 38.5 36.6  MCV 80.2 81.3  PLT 414* 431*   Cardiac Enzymes: No results for input(s): CKTOTAL, CKMB, CKMBINDEX, TROPONINI in the last 168 hours. BNP: Invalid input(s): POCBNP CBG: No results for input(s): GLUCAP in the last 168 hours. D-Dimer No results for input(s): DDIMER in the last 72 hours. Hgb A1c No results for input(s): HGBA1C in the last 72  hours. Lipid Profile No results for input(s): CHOL, HDL, LDLCALC, TRIG, CHOLHDL, LDLDIRECT in the last 72 hours. Thyroid function studies No results for input(s): TSH, T4TOTAL, T3FREE, THYROIDAB in the last 72 hours.  Invalid input(s): FREET3 Anemia work up No results for input(s): VITAMINB12, FOLATE, FERRITIN, TIBC, IRON, RETICCTPCT in the last 72 hours. Urinalysis    Component Value Date/Time   COLORURINE AMBER (A) 02/18/2024 1121   APPEARANCEUR HAZY (A) 02/18/2024 1121   LABSPEC 1.034 (H) 02/18/2024 1121   PHURINE 5.0 02/18/2024 1121   GLUCOSEU NEGATIVE 02/18/2024 1121   HGBUR NEGATIVE 02/18/2024 1121   BILIRUBINUR SMALL (A) 02/18/2024 1121   KETONESUR NEGATIVE 02/18/2024 1121   PROTEINUR 100 (A) 02/18/2024 1121   NITRITE NEGATIVE 02/18/2024 1121   LEUKOCYTESUR NEGATIVE 02/18/2024 1121   Sepsis Labs Recent Labs  Lab 02/18/24 1011 02/19/24 0916  WBC 14.9* 12.3*   Microbiology Recent Results (from the past 240 hours)  Resp panel by RT-PCR (RSV, Flu A&B, Covid) Anterior Nasal Swab     Status: None   Collection Time: 02/18/24 11:21 AM   Specimen: Anterior Nasal Swab  Result Value Ref Range Status   SARS Coronavirus  2 by RT PCR NEGATIVE NEGATIVE Final    Comment: (NOTE) SARS-CoV-2 target nucleic acids are NOT DETECTED.  The SARS-CoV-2 RNA is generally detectable in upper respiratory specimens during the acute phase of infection. The lowest concentration of SARS-CoV-2 viral copies this assay can detect is 138 copies/mL. A negative result does not preclude SARS-Cov-2 infection and should not be used as the sole basis for treatment or other patient management decisions. A negative result may occur with  improper specimen collection/handling, submission of specimen other than nasopharyngeal swab, presence of viral mutation(s) within the areas targeted by this assay, and inadequate number of viral copies(<138 copies/mL). A negative result  must be combined with clinical observations, patient history, and epidemiological information. The expected result is Negative.  Fact Sheet for Patients:  BloggerCourse.com  Fact Sheet for Healthcare Providers:  SeriousBroker.it  This test is no t yet approved or cleared by the [place]  FDA and  has been authorized for detection and/or diagnosis of SARS-CoV-2 by FDA under an Emergency Use Authorization (EUA). This EUA will remain  in effect (meaning this test can be used) for the duration of the COVID-19 declaration under Section 564(b)(1) of the Act, 21 U.S.C.section 360bbb-3(b)(1), unless the authorization is terminated  or revoked sooner.       Influenza A by PCR NEGATIVE NEGATIVE Final   Influenza B by PCR NEGATIVE NEGATIVE Final    Comment: (NOTE) The Xpert Xpress SARS-CoV-2/FLU/RSV plus assay is intended as an aid in the diagnosis of influenza from Nasopharyngeal swab specimens and should not be used as a sole basis for treatment. Nasal washings and aspirates are unacceptable for Xpert Xpress SARS-CoV-2/FLU/RSV testing.  Fact Sheet for Patients: BloggerCourse.com  Fact Sheet for Healthcare Providers: SeriousBroker.it  This test is not yet approved or cleared by the [place]  FDA and has been authorized for detection and/or diagnosis of SARS-CoV-2 by FDA under an Emergency Use Authorization (EUA). This EUA will remain in effect (meaning this test can be used) for the duration of the COVID-19 declaration under Section 564(b)(1) of the Act, 21 U.S.C. section 360bbb-3(b)(1), unless the authorization is terminated or revoked.     Resp Syncytial Virus by PCR NEGATIVE NEGATIVE Final    Comment: (NOTE) Fact Sheet for Patients: BloggerCourse.com  Fact Sheet for Healthcare Providers: SeriousBroker.it  This test is  not yet approved or cleared by the [place]  FDA and has been authorized for detection and/or diagnosis of SARS-CoV-2 by FDA under an Emergency Use Authorization (EUA). This EUA will remain in effect (meaning this test can be used) for the duration of the COVID-19 declaration under Section 564(b)(1) of the Act, 21 U.S.C. section 360bbb-3(b)(1), unless the authorization is terminated or revoked.  Performed at [facility], [address], [place], [place] [address]      Time coordinating discharge: 32 min  SIGNED: Lorane Poland, DO [facility] 02/19/2024, 2:39 PM Pager   If 7PM-7AM, please contact night-coverage

## 2024-02-19 NOTE — Discharge Instructions (Signed)
 Some PCP options in [place] area- not a comprehensive list  [facility]- [phone] [facility]- [phone] [facility]- [phone] [facility]- [phone] [facility]- [phone] Nichole Molly- [phone]  or [facility] Physician Referral Line [phone]

## 2024-02-19 NOTE — Progress Notes (Signed)
 Brief assessment:  Patient is from home with new colorectal cancer with extensive metastasis lesions to abdomen/pelvic adenopathy, mediastinum, liver and lungs.  Patient doesn't have a PCP. TOC added a list of PCPs to the patient's AVS. No therapy evals are ordered. No other TOC needs identified at this time.

## 2024-02-19 NOTE — Procedures (Signed)
 Interventional Radiology Procedure Note  Procedure: US  Guided Biopsy of left lobe liver mass  Complications: None  Estimated Blood Loss: < 10 mL  Findings: 18 G core biopsy of 12 cm left lobe mass performed under US  guidance.  Three core samples obtained and sent to Pathology.  Marcey DASEN. Luverne, M.D Pager:  [phone]

## 2024-02-19 NOTE — Consult Note (Signed)
 Chief Complaint:  Hepatic lesions  Procedure: Liver lesion biopsy  Referring Provider(s): Dr. SHAUNNA Mana  Supervising Physician: Luverne Aran  Patient Status: [facility] - In-pt  History of Present Illness: Melissa Johns is a 58 y.o. female who presented to the ED on 10/19 with complaints of progressively worsening abdominal pain. Patient reports that she has had 10/10 pain for roughly the past 3 days. Admits to some mild abdominal pain for several weeks prior to this, but originally did not think this was related to her pain in the past few days. ED workup significant for mild leukocytosis, mildly elevated AST and Alk phos and imaging concerning for infiltrative bilateral hepatic masses and metastatic disease. IR consulted for possible liver mass biopsy. Case and imaging reviewed and approved by Dr. KANDICE Luverne.  Patient sitting upright in bed in no acute distress with family at the bedside. States that she is feeling okay right now since she is on pain medicine; however, if medicine wears off she continues to have severe RUQ pain. Also admits to associated nausea, but denies any vomiting. Reports intermittent hot sweats/chills with a documented fever last week, but has had no reported fevers since. She denies any chest pain, shortness of breath, changes in bowel/bladder habits, or unintentional weight changes. NPO since midnight. All questions and concerns answered at the bedside.   Patient is Full Code  History reviewed. No pertinent past medical history.  History reviewed. No pertinent surgical history.  Allergies: Penicillins  Medications: Prior to Admission medications   Medication Sig Start Date End Date Taking? Authorizing Provider  lisinopril-hydrochlorothiazide (ZESTORETIC) 20-12.5 MG tablet Take 1 tablet by mouth daily. 02/18/24 02/17/25 Yes Mana Cort DASEN, MD  methylPREDNISolone  (MEDROL  DOSEPAK) 4 MG TBPK tablet Take 6 pills on day one then decrease by 1 pill each day Patient  not taking: Reported on 02/18/2024 06/19/17   Gasper Devere ORN, PA-C  traMADol  (ULTRAM ) 50 MG tablet Take 1 tablet (50 mg total) by mouth every 6 (six) hours as needed. Patient not taking: Reported on 02/18/2024 06/19/17   Gasper Devere ORN, PA-C     Family History  Problem Relation Age of Onset   Breast cancer Sister 27    Social History   Socioeconomic History   Marital status: Married    Spouse name: Not on file   Number of children: Not on file   Years of education: Not on file   Highest education level: Not on file  Occupational History   Not on file  Tobacco Use   Smoking status: Never   Smokeless tobacco: Never  Substance and Sexual Activity   Alcohol use: No   Drug use: No   Sexual activity: Not on file  Other Topics Concern   Not on file  Social History Narrative   Not on file   Social Drivers of Health   Financial Resource Strain: Not on file  Food Insecurity: No Food Insecurity (02/18/2024)   Hunger Vital Sign    Worried About Running Out of Food in the Last Year: Never true    Ran Out of Food in the Last Year: Never true  Transportation Needs: No Transportation Needs (02/18/2024)   PRAPARE - Administrator, Civil Service (Medical): No    Lack of Transportation (Non-Medical): No  Physical Activity: Not on file  Stress: Not on file  Social Connections: Not on file    Review of Systems  Constitutional:  Positive for chills.  Gastrointestinal:  Positive  for abdominal pain (RUQ) and nausea. Negative for constipation, diarrhea and vomiting.  Patient denies any headache, chest pain, shortness of breath, vomiting, diarrhea, or constipation. All other systems are negative.   Vital Signs: BP (!) 119/58 (BP Location: Right Arm)   Pulse 65   Temp 97.9 F (36.6 C)   Resp 17   Ht 5' 9 (1.753 m)   Wt (!) 314 lb 9.5 oz (142.7 kg)   LMP 12/21/2015   SpO2 92%   BMI 46.46 kg/m     Physical Exam Constitutional:      Appearance: Normal appearance.   HENT:     Mouth/Throat:     Mouth: Mucous membranes are moist.     Pharynx: Oropharynx is clear.  Cardiovascular:     Rate and Rhythm: Normal rate and regular rhythm.     Heart sounds: Normal heart sounds.  Pulmonary:     Effort: Pulmonary effort is normal.     Breath sounds: Normal breath sounds.  Abdominal:     General: Abdomen is flat.     Palpations: Abdomen is soft.     Tenderness: There is abdominal tenderness (epigastric and RUQ).  Skin:    General: Skin is warm and dry.  Neurological:     Mental Status: She is alert and oriented to person, place, and time.  Psychiatric:        Behavior: Behavior normal.     Imaging: CT Angio Chest PE W and/or Wo Contrast Result Date: 02/18/2024 CLINICAL DATA:  Shortness of breath. Metastatic disease identified on abdomen and pelvis CT earlier today. * Tracking Code: BO * EXAM: CT ANGIOGRAPHY CHEST WITH CONTRAST TECHNIQUE: Multidetector CT imaging of the chest was performed using the standard protocol during bolus administration of intravenous contrast. Multiplanar CT image reconstructions and MIPs were obtained to evaluate the vascular anatomy. RADIATION DOSE REDUCTION: This exam was performed according to the departmental dose-optimization program which includes automated exposure control, adjustment of the mA and/or kV according to patient size and/or use of iterative reconstruction technique. CONTRAST:  75mL OMNIPAQUE IOHEXOL 350 MG/ML SOLN COMPARISON:  None Available. FINDINGS: Cardiovascular: Heart size mildly enlarged. No substantial pericardial effusion. Mild atherosclerotic calcification is noted in the wall of the thoracic aorta. Enlargement of the pulmonary outflow tract/main pulmonary arteries suggests pulmonary arterial hypertension. No large central pulmonary embolus in the main or lobar pulmonary arteries. No definite segmental or subsegmental pulmonary embolus evident although assessment is limited by bolus timing and evaluation of  these arteries may be unreliable. Mediastinum/Nodes: Bulky mediastinal lymphadenopathy including a 2.7 cm short axis right paratracheal lymph node and 2.5 cm short axis prevascular lymph node. There is bilateral hilar lymphadenopathy. The esophagus has normal imaging features. There is no axillary lymphadenopathy. Lungs/Pleura: Rr 18 mm parahilar right lung nodule identified on 65/6. 7 mm right lower lobe pulmonary nodule identified on 72/6. Upper Abdomen: See report for abdomen/pelvis CT performed immediately prior to this study and dictated separately. Musculoskeletal: No worrisome lytic or sclerotic osseous abnormality. Review of the MIP images confirms the above findings. IMPRESSION: 1. No large central pulmonary embolus in the main or lobar pulmonary arteries. No definite segmental or subsegmental pulmonary embolus evident although assessment is limited by bolus timing and evaluation of these arteries may be unreliable. 2. Bulky mediastinal and bilateral hilar lymphadenopathy. Imaging features are compatible with metastatic disease. 3. 18 mm parahilar right lung nodule with 7 mm right lower lobe pulmonary nodule. Imaging features are compatible with metastatic disease. 4. Enlargement of  the pulmonary outflow tract/main pulmonary arteries suggests pulmonary arterial hypertension. 5.  Aortic Atherosclerosis (ICD10-I70.0). Electronically Signed   By: Camellia Candle M.D.   On: 02/18/2024 13:50   CT ABDOMEN PELVIS W CONTRAST Result Date: 02/18/2024 CLINICAL DATA:  Shortness of breath. Diarrhea. Right flank pain. * Tracking Code: BO * EXAM: CT ABDOMEN AND PELVIS WITH CONTRAST TECHNIQUE: Multidetector CT imaging of the abdomen and pelvis was performed using the standard protocol following bolus administration of intravenous contrast. RADIATION DOSE REDUCTION: This exam was performed according to the departmental dose-optimization program which includes automated exposure control, adjustment of the mA and/or kV  according to patient size and/or use of iterative reconstruction technique. CONTRAST:  100mL OMNIPAQUE IOHEXOL 300 MG/ML  SOLN COMPARISON:  None Available. FINDINGS: Lower chest: Mild cardiomegaly, without pericardial or pleural effusion. Marked lower thoracic adenopathy, with a node in the azygoesophageal recess measuring 2.5 x 3.3 cm on 01/02. Hepatobiliary: Hepatomegaly at 27.1 cm craniocaudal. Irregular hepatic capsule which is most likely due multiple hepatic masses. The dominant segment 4 B mass measures 10.1 x 9.4 cm on 35/2 and causes upstream mild intrahepatic duct dilatation in segment 2 on image 28/2. Infiltrative right-sided masses including at 7.8 x 5.3 cm on 47/2. Normal gallbladder, without biliary ductal dilatation. Pancreas: Normal, without mass or ductal dilatation. Spleen: Normal in size, without focal abnormality. Adrenals/Urinary Tract: Left larger than right adrenal nodules. Example on the left at 3.3 cm and 8 HU. On the right at 1.8 cm and 15 HU. Upper pole right renal sinus cyst of 2.3 cm. Normal left kidney. No hydronephrosis. Normal urinary bladder. Stomach/Bowel: Normal stomach, without wall thickening. Eccentric right left rectal wall thickening on image 84/2. more significant eccentric right rectosigmoid wall thickening including on 76/2. Example 2.6 x 3.0 cm. Extensive colonic diverticulosis. Colonic stool burden suggests constipation. Normal terminal ileum and appendix. Normal small bowel. Vascular/Lymphatic: Aortic atherosclerosis. Significant abdominal retroperitoneal adenopathy. Index gastrohepatic ligament node measures 2.2 cm on 21/2. No pelvic sidewall adenopathy. There is extensive adenopathy within the sigmoid mesocolon with a nodal mass measuring 3.8 x 4.2 cm on 70/2. Reproductive: Normal uterus and adnexa. Other: Trace perihepatic ascites. No evidence of omental or peritoneal disease. Musculoskeletal: No acute osseous abnormality. IMPRESSION: 1. Constellation of findings  which are consistent with widespread metastatic disease. Massive lower thoracic, mild-to-moderate abdominal, and moderate pelvic adenopathy. Infiltrative bilateral hepatic masses. 2. The primary is most likely in the rectum or rectosigmoid colon, with areas of eccentric right wall thickening as detailed above. 3. Possible constipation. No evidence of high-grade bowel obstruction. 4. Left larger than right adrenal lesions. The left-sided lesion is consistent with an adenoma. The right-sided lesion is technically indeterminate but favored to represent an adenoma. 5. Segment 2 intrahepatic biliary duct dilatation secondary to the dominant segment 4A mass. Consider correlation with bilirubin levels. 6. Trace perihepatic ascites. 7. Incidental findings, including: Cardiomegaly. Aortic Atherosclerosis (ICD10-I70.0). Electronically Signed   By: Rockey Kilts M.D.   On: 02/18/2024 12:50   DG Chest 2 View Result Date: 02/18/2024 CLINICAL DATA:  Shortness of breath and epigastric pain. EXAM: CHEST - 2 VIEW COMPARISON:  None Available. FINDINGS: The heart size and mediastinal contours are within normal limits. No acute infiltrate, pleural effusion or pneumothorax identified. The visualized skeletal structures are unremarkable. IMPRESSION: No active cardiopulmonary disease. Electronically Signed   By: Suzen Dials M.D.   On: 02/18/2024 10:31    Labs:  CBC: Recent Labs    02/18/24 1011  WBC 14.9*  HGB  12.4  HCT 38.5  PLT 414*    COAGS: Recent Labs    02/18/24 2030  INR 1.1    BMP: Recent Labs    02/18/24 1011 02/19/24 0434  NA 136  --   K 3.3* 3.3*  CL 98  --   CO2 25  --   GLUCOSE 119*  --   BUN 17  --   CALCIUM 9.2  --   CREATININE 0.46  --   GFRNONAA >60  --     LIVER FUNCTION TESTS: Recent Labs    02/18/24 1011  BILITOT 0.9  AST 57*  ALT 22  ALKPHOS 129*  PROT 7.7  ALBUMIN 3.4*    TUMOR MARKERS: No results for input(s): AFPTM, CEA, CA199, CHROMGRNA in the  last 8760 hours.  Assessment and Plan:  Hepatic Lesions: Lashunta C Heinicke is a 58 y.o. female with no significant history who presented to the ED on 10/19 with complaints of progressively worsening abdominal pain. Imaging revealed hepatic lesions and concerns for metastatic disease. IR consulted for liver lesion biopsy and case approved by Dr. KANDICE Moan. Procedure to be performed under moderate sedation.  -NPO since midnight -Not on AC; INR 1.1 -WBC 12.3 < 14.9 yesterday  -Liver enzymes improved today; CEA pending -Plan for liver lesion biopsy in [place]  with Dr. Moan on 10/20 should schedule allow   Risks and benefits of liver mass biopsy was discussed with the patient and/or patient's family including, but not limited to bleeding, infection, damage to adjacent structures or low yield requiring additional tests.  All of the questions were answered and there is agreement to proceed.  Consent signed and in chart.   Thank you for allowing our service to participate in Khyra C Ludolph 's care.    Electronically Signed: Glennon CHRISTELLA Bal, PA-C   02/19/2024, 10:27 AM     I spent a total of 40 Minutes in face to face in clinical consultation, greater than 50% of which was counseling/coordinating care for liver mass.

## 2024-02-20 ENCOUNTER — Emergency Department
Admission: EM | Admit: 2024-02-20 | Discharge: 2024-02-21 | Disposition: A | Discharge status: Home / Self Care | Attending: Emergency Medicine | Admitting: Emergency Medicine

## 2024-02-20 ENCOUNTER — Other Ambulatory Visit: Payer: Self-pay

## 2024-02-20 ENCOUNTER — Emergency Department

## 2024-02-20 DIAGNOSIS — C786 Secondary malignant neoplasm of retroperitoneum and peritoneum: Secondary | ICD-10-CM | POA: Diagnosis not present

## 2024-02-20 DIAGNOSIS — C801 Malignant (primary) neoplasm, unspecified: Secondary | ICD-10-CM | POA: Insufficient documentation

## 2024-02-20 DIAGNOSIS — R1010 Upper abdominal pain, unspecified: Secondary | ICD-10-CM

## 2024-02-20 LAB — URINALYSIS, ROUTINE W REFLEX MICROSCOPIC
Bilirubin Urine: NEGATIVE
Glucose, UA: NEGATIVE mg/dL
Hgb urine dipstick: NEGATIVE
Ketones, ur: NEGATIVE mg/dL
Leukocytes,Ua: NEGATIVE
Nitrite: NEGATIVE
Protein, ur: NEGATIVE mg/dL
Specific Gravity, Urine: 1.021 (ref 1.005–1.030)
pH: 5 (ref 5.0–8.0)

## 2024-02-20 LAB — COMPREHENSIVE METABOLIC PANEL WITH GFR
ALT: 20 U/L (ref 0–44)
AST: 39 U/L (ref 15–41)
Albumin: 3.3 g/dL — ABNORMAL LOW (ref 3.5–5.0)
Alkaline Phosphatase: 127 U/L — ABNORMAL HIGH (ref 38–126)
Anion gap: 14 (ref 5–15)
BUN: 16 mg/dL (ref 6–20)
CO2: 27 mmol/L (ref 22–32)
Calcium: 9.6 mg/dL (ref 8.9–10.3)
Chloride: 94 mmol/L — ABNORMAL LOW (ref 98–111)
Creatinine, Ser: 0.52 mg/dL (ref 0.44–1.00)
GFR, Estimated: >60 mL/min (ref >60)
Glucose, Bld: 98 mg/dL (ref 70–99)
Potassium: 3.6 mmol/L (ref 3.5–5.1)
Sodium: 135 mmol/L (ref 135–145)
Total Bilirubin: 0.7 mg/dL (ref 0.0–1.2)
Total Protein: 7.5 g/dL (ref 6.5–8.1)

## 2024-02-20 LAB — CBC
HCT: 39.3 % (ref 36.0–46.0)
Hemoglobin: 12.3 g/dL (ref 12.0–15.0)
MCH: 25.2 pg — ABNORMAL LOW (ref 26.0–34.0)
MCHC: 31.3 g/dL (ref 30.0–36.0)
MCV: 80.4 fL (ref 80.0–100.0)
Platelets: 436 K/uL — ABNORMAL HIGH (ref 150–400)
RBC: 4.89 MIL/uL (ref 3.87–5.11)
RDW: 14.4 % (ref 11.5–15.5)
WBC: 13.7 K/uL — ABNORMAL HIGH (ref 4.0–10.5)
nRBC: 0 % (ref 0.0–0.2)

## 2024-02-20 LAB — LIPASE, BLOOD: Lipase: 28 U/L (ref 11–51)

## 2024-02-20 LAB — CEA: CEA: 388 ng/mL — ABNORMAL HIGH (ref 0.0–4.7)

## 2024-02-20 MED ORDER — HYDROMORPHONE HCL 1 MG/ML IJ SOLN
0.5000 mg | INTRAMUSCULAR | Status: DC | PRN
  Administered 2024-02-21: 0.5 mg via INTRAVENOUS
  Filled 2024-02-20: qty 0.5

## 2024-02-20 MED ORDER — KETOROLAC TROMETHAMINE 15 MG/ML IJ SOLN
15.0000 mg | Freq: Once | INTRAMUSCULAR | Status: AC
  Administered 2024-02-21: 15 mg via INTRAVENOUS
  Filled 2024-02-20: qty 1

## 2024-02-20 MED ORDER — IOHEXOL 350 MG/ML SOLN
100.0000 mL | Freq: Once | INTRAVENOUS | Status: AC | PRN
  Administered 2024-02-21: 100 mL via INTRAVENOUS

## 2024-02-20 NOTE — ED Notes (Addendum)
 First Nurse Note: Pt to ED via [facility] from home for RUQ abdominal pain x 1 hour pt is s/p liver biopsy yesterday.  Pt just diagnosed with metastatic cancer. Pt discharged from hospital yesterday. Pt took tylenol and Oxycodone prior to EMS arrival.

## 2024-02-20 NOTE — ED Triage Notes (Addendum)
 Pt arrives ems c/o RUQ that started around 1700 today w/ nausea and sob, worse w/ deep inspiration or movement. Pt recently diagnosed w/ colorectal cancer, seen here on 02/18/24. Pain in RUQ is new, pt took oxycodone around 1730 without relief. NADN

## 2024-02-20 NOTE — ED Notes (Signed)
 Blue, green, and lav top tubes sent to lab

## 2024-02-20 NOTE — ED Notes (Signed)
 Pt assisted to restroom w/ 2-st and-by assist. Urine sample collected and sent to lab at this time.  Pt reports taking oxycodone 5-325 @2100 .

## 2024-02-21 ENCOUNTER — Telehealth: Payer: Self-pay | Admitting: Emergency Medicine

## 2024-02-21 LAB — TROPONIN I (HIGH SENSITIVITY): Troponin I (High Sensitivity): 8 ng/L (ref <18)

## 2024-02-21 LAB — SURGICAL PATHOLOGY

## 2024-02-21 MED ORDER — POLYETHYLENE GLYCOL 3350 17 G PO PACK
17.0000 g | PACK | Freq: Two times a day (BID) | ORAL | 0 refills | Status: AC
Start: 2024-02-21 — End: 2024-05-21

## 2024-02-21 MED ORDER — HYDROMORPHONE HCL 2 MG PO TABS
1.0000 mg | ORAL_TABLET | Freq: Once | ORAL | Status: AC
Start: 2024-02-21 — End: 2024-02-21
  Administered 2024-02-21: 1 mg via ORAL
  Filled 2024-02-21 (×2): qty 1

## 2024-02-21 MED ORDER — HYDROMORPHONE HCL 2 MG PO TABS: 1.0000 mg | ORAL_TABLET | Freq: Four times a day (QID) | ORAL | 0 refills | Status: DC | PRN

## 2024-02-21 NOTE — Telephone Encounter (Signed)
-----------------------------------------   12:20 PM on 02/21/24 ----------------------------------------- Patient contacted the ED stating that pharmacy where Dilaudid was prescribed did not have the medication available.  Given patient currently undergoing treatment for metastatic cancer, prescription was resent to alternative pharmacy at patient request.

## 2024-02-21 NOTE — ED Provider Notes (Incomplete)
 [facility] Provider Note    Event Date/Time   First MD Initiated Contact with Patient 02/20/24 2346     (approximate)   History   Abdominal Pain   HPI  Melissa Johns is a 58 y.o. female   Past medical history of newly diagnosed suspected metastatic disease throughout the abdomen, mediastinum, lungs, with a liver biopsy 2 days ago presenting with upper abdominal pain.  Similar to presenting symptoms that brought her to the emergency department on 19 October which revealed suspected metastatic disease as above, and underwent liver biopsy on the 20th.  Was recovering well.  Residual upper abdominal pain got worse tonight.  Mild nausea but no vomiting.  Passing gas.  No urinary symptoms.  Denies chest pain or shortness of breath.  No other acute medical complaints.  Not yet started on any cancer treatment  Independent Historian contributed to assessment above: Daughter and husband corroborate information above.  External Medical Documents Reviewed: Previous hospital notes and previous imaging noting likely metastatic disease in the mediastinum, lungs, intra-abdominal      Physical Exam   Triage Vital Signs: ED Triage Vitals  Encounter Vitals Group     BP 02/20/24 1906 (!) 148/61     Girls Systolic BP Percentile --      Girls Diastolic BP Percentile --      Boys Systolic BP Percentile --      Boys Diastolic BP Percentile --      Pulse Rate 02/20/24 1906 72     Resp 02/20/24 1906 18     Temp 02/20/24 1906 98 F (36.7 C)     Temp Source 02/20/24 1906 Oral     SpO2 02/20/24 1906 92 %     Weight --      Height --      Head Circumference --      Peak Flow --      Pain Score 02/20/24 1904 8     Pain Loc --      Pain Education --      Exclude from Growth Chart --     Most recent vital signs: Vitals:   02/20/24 1906  BP: (!) 148/61  Pulse: 72  Resp: 18  Temp: 98 F (36.7 C)  SpO2: 92%    General: Awake, no distress.  CV:  Good  peripheral perfusion.  Resp:  Normal effort.  Abd:  No distention.  Other:  Abdomen with diffuse tenderness without rigidity or guarding.  Hemodynamics significant for hypertension otherwise vital signs normal, afebrile.  The skin is marked on the abdomen, with no obvious infectious changes   ED Results / Procedures / Treatments   Labs (all labs ordered are listed, but only abnormal results are displayed) Labs Reviewed  COMPREHENSIVE METABOLIC PANEL WITH GFR - Abnormal; Notable for the following components:      Result Value   Chloride 94 (*)    Albumin 3.3 (*)    Alkaline Phosphatase 127 (*)    All other components within normal limits  CBC - Abnormal; Notable for the following components:   WBC 13.7 (*)    MCH 25.2 (*)    Platelets 436 (*)    All other components within normal limits  URINALYSIS, ROUTINE W REFLEX MICROSCOPIC - Abnormal; Notable for the following components:   Color, Urine YELLOW (*)    APPearance HAZY (*)    All other components within normal limits  LIPASE, BLOOD     I ordered  and reviewed the above labs they are notable for white blood cell count 13.7 otherwise cell counts and electrolytes largely unremarkable  EKG  ED ECG REPORT I, Ginnie Shams, the attending physician, personally viewed and interpreted this ECG.   Date: 02/21/2024  EKG Time: ***  Rate: ***  Rhythm: {ekg findings:315101}  Axis: ***  Intervals:{conduction defects:17367}  ST&T Change: ***    RADIOLOGY I independently reviewed and interpreted *** I also reviewed radiologist's formal read.   PROCEDURES:  Critical Care performed: {CriticalCareYesNo:19197::Yes, see critical care procedure note(s),No}  Procedures   MEDICATIONS ORDERED IN ED: Medications  iohexol (OMNIPAQUE) 350 MG/ML injection 100 mL (has no administration in time range)  ketorolac (TORADOL) 15 MG/ML injection 15 mg (has no administration in time range)  HYDROmorphone (DILAUDID) injection 0.5 mg (has no  administration in time range)    External physician / consultants:  I spoke with *** regarding care plan for this patient.   IMPRESSION / MDM / ASSESSMENT AND PLAN / ED COURSE  I reviewed the triage vital signs and the nursing notes.                                Patient's presentation is most consistent with {EM COPA:27473}  Differential diagnosis includes, but is not limited to, ***   ***The patient is on the cardiac monitor to evaluate for evidence of arrhythmia and/or significant heart rate changes.  MDM:  ***  I considered hospitalization for admission or observation ***        FINAL CLINICAL IMPRESSION(S) / ED DIAGNOSES   Final diagnoses:  Pain of upper abdomen     Rx / DC Orders   ED Discharge Orders          Ordered    Ambulatory referral to Pain Clinic        02/21/24 0000             Note:  This document was prepared using Dragon voice recognition software and may include unintentional dictation errors.

## 2024-02-21 NOTE — Discharge Instructions (Addendum)
 Fortunately your CT scan did not show any changes that would require hospitalization or surgeries today.  I think that your pain is related to the cancer.  Follow-up with the cancer doctors.  I have wrote you a prescription for Dilaudid.  Do not take any other narcotic pain medications while taking this pain medication.  Take only as prescribed.  Use MiraLAX twice daily as the narcotic pain medications can cause constipation.  I made a referral to a pain clinic who should call you for an appointment.  Thank you for choosing us  for your health care today!  Please see your primary doctor this week for a follow up appointment.   If you have any new, worsening, or unexpected symptoms call your doctor right away or come back to the emergency department for reevaluation.  It was my pleasure to care for you today.   Ginnie EDISON Cyrena, MD

## 2024-02-21 NOTE — ED Provider Notes (Signed)
 [facility] Provider Note    Event Date/Time   First MD Initiated Contact with Patient 02/20/24 2346     (approximate)   History   Abdominal Pain   HPI  Melissa Johns is a 58 y.o. female   Past medical history of newly diagnosed suspected metastatic disease throughout the abdomen, mediastinum, lungs, with a liver biopsy 2 days ago presenting with upper abdominal pain.  Similar to presenting symptoms that brought her to the emergency department on 19 October which revealed suspected metastatic disease as above, and underwent liver biopsy on the 20th.  Was recovering well.  Residual upper abdominal pain got worse tonight.  Mild nausea but no vomiting.  Passing gas.  No urinary symptoms.  Denies chest pain or shortness of breath.  No other acute medical complaints.  Not yet started on any cancer treatment  Independent Historian contributed to assessment above: Daughter and husband corroborate information above.  External Medical Documents Reviewed: Previous hospital notes and previous imaging noting likely metastatic disease in the mediastinum, lungs, intra-abdominal      Physical Exam   Triage Vital Signs: ED Triage Vitals  Encounter Vitals Group     BP 02/20/24 1906 (!) 148/61     Girls Systolic BP Percentile --      Girls Diastolic BP Percentile --      Boys Systolic BP Percentile --      Boys Diastolic BP Percentile --      Pulse Rate 02/20/24 1906 72     Resp 02/20/24 1906 18     Temp 02/20/24 1906 98 F (36.7 C)     Temp Source 02/20/24 1906 Oral     SpO2 02/20/24 1906 92 %     Weight --      Height --      Head Circumference --      Peak Flow --      Pain Score 02/20/24 1904 8     Pain Loc --      Pain Education --      Exclude from Growth Chart --     Most recent vital signs: Vitals:   02/21/24 0040 02/21/24 0130  BP: (!) 111/46 (!) 132/46  Pulse: 74 66  Resp: 20 12  Temp:    SpO2: 94%     General: Awake, no distress.   CV:  Good peripheral perfusion.  Resp:  Normal effort.  Abd:  No distention.  Other:  Abdomen with diffuse tenderness without rigidity or guarding.  Hemodynamics significant for hypertension otherwise vital signs normal, afebrile.  The skin is marked on the abdomen, with no obvious infectious changes   ED Results / Procedures / Treatments   Labs (all labs ordered are listed, but only abnormal results are displayed) Labs Reviewed  COMPREHENSIVE METABOLIC PANEL WITH GFR - Abnormal; Notable for the following components:      Result Value   Chloride 94 (*)    Albumin 3.3 (*)    Alkaline Phosphatase 127 (*)    All other components within normal limits  CBC - Abnormal; Notable for the following components:   WBC 13.7 (*)    MCH 25.2 (*)    Platelets 436 (*)    All other components within normal limits  URINALYSIS, ROUTINE W REFLEX MICROSCOPIC - Abnormal; Notable for the following components:   Color, Urine YELLOW (*)    APPearance HAZY (*)    All other components within normal limits  LIPASE, BLOOD  TROPONIN I (HIGH SENSITIVITY)  TROPONIN I (HIGH SENSITIVITY)     I ordered and reviewed the above labs they are notable for white blood cell count 13.7 otherwise cell counts and electrolytes largely unremarkable  EKG  ED ECG REPORT I, Ginnie Shams, the attending physician, personally viewed and interpreted this ECG.   Date: 02/21/2024  EKG Time: 1903  Rate: 67  Rhythm: sinus  Axis: LAD  Intervals: Nonspecific intraventricular conduction delay  ST&T Change: No STEMI    RADIOLOGY I independently reviewed and interpreted CT abdomen and see no obvious obstructive or inflammatory changes I also reviewed radiologist's formal read.   PROCEDURES:  Critical Care performed: No  Procedures   MEDICATIONS ORDERED IN ED: Medications  HYDROmorphone (DILAUDID) injection 0.5 mg (0.5 mg Intravenous Given 02/21/24 0039)  iohexol (OMNIPAQUE) 350 MG/ML injection 100 mL (100 mLs  Intravenous Contrast Given 02/21/24 0004)  ketorolac (TORADOL) 15 MG/ML injection 15 mg (15 mg Intravenous Given 02/21/24 0031)    IMPRESSION / MDM / ASSESSMENT AND PLAN / ED COURSE  I reviewed the triage vital signs and the nursing notes.                                Patient's presentation is most consistent with acute presentation with potential threat to life or bodily function.  Differential diagnosis includes, but is not limited to, cancer related pain, peritoneal carcinomatosis, ascites, obstruction, biliary pathologies, GERD/ulcer/gastritis, perforated viscus, infection related to biopsy, bleeding related to biopsy, abscess   The patient is on the cardiac monitor to evaluate for evidence of arrhythmia and/or significant heart rate changes.  MDM:    Pain worsened after biopsy with for metastatic disease.  Pain may be due to her metastatic disease throughout the abdomen noted on prior CT scan or biopsy related complications.  CT scan to evaluate.  Pain control.  Pain very well-controlled on the IV Dilaudid.  Her oxycodone was not doing anything to help with pain previously so I have prescribed her Dilaudid with instructions to discontinue oxycodone.  MiraLAX as well.  Her CT scan did not show any acute changes.  I think her pain is cancer related.  I considered hospitalization for admission or observation however given pain adequately controlled and no new acute findings on CT scan, I think she is appropriate for outpatient treatment and follow-up.        FINAL CLINICAL IMPRESSION(S) / ED DIAGNOSES   Final diagnoses:  Pain of upper abdomen  Peritoneal carcinomatosis (HCC)     Rx / DC Orders   ED Discharge Orders          Ordered    HYDROmorphone (DILAUDID) 2 MG tablet  Every 6 hours PRN        02/21/24 0235    polyethylene glycol (MIRALAX) 17 g packet  2 times daily        02/21/24 0235    Ambulatory referral to Pain Clinic        02/21/24 0000              Note:  This document was prepared using Dragon voice recognition software and may include unintentional dictation errors.    Shams Ginnie, MD 02/21/24 [phone]

## 2024-02-23 ENCOUNTER — Encounter: Payer: Self-pay | Admitting: Oncology

## 2024-02-23 ENCOUNTER — Other Ambulatory Visit: Payer: Self-pay

## 2024-02-23 ENCOUNTER — Other Ambulatory Visit: Payer: Self-pay | Admitting: Oncology

## 2024-02-23 ENCOUNTER — Inpatient Hospital Stay: Attending: Oncology | Admitting: Oncology

## 2024-02-23 ENCOUNTER — Telehealth: Payer: Self-pay

## 2024-02-23 VITALS — BP 126/92 | HR 101 | Temp 98.0°F | Resp 20 | Ht 70.0 in | Wt 310.4 lb

## 2024-02-23 DIAGNOSIS — Z5111 Encounter for antineoplastic chemotherapy: Secondary | ICD-10-CM | POA: Diagnosis present

## 2024-02-23 DIAGNOSIS — C19 Malignant neoplasm of rectosigmoid junction: Secondary | ICD-10-CM

## 2024-02-23 DIAGNOSIS — C787 Secondary malignant neoplasm of liver and intrahepatic bile duct: Secondary | ICD-10-CM | POA: Diagnosis not present

## 2024-02-23 DIAGNOSIS — C772 Secondary and unspecified malignant neoplasm of intra-abdominal lymph nodes: Secondary | ICD-10-CM | POA: Diagnosis not present

## 2024-02-23 DIAGNOSIS — C7972 Secondary malignant neoplasm of left adrenal gland: Secondary | ICD-10-CM | POA: Insufficient documentation

## 2024-02-23 DIAGNOSIS — R112 Nausea with vomiting, unspecified: Secondary | ICD-10-CM | POA: Insufficient documentation

## 2024-02-23 DIAGNOSIS — Z79899 Other long term (current) drug therapy: Secondary | ICD-10-CM | POA: Diagnosis not present

## 2024-02-23 DIAGNOSIS — C771 Secondary and unspecified malignant neoplasm of intrathoracic lymph nodes: Secondary | ICD-10-CM | POA: Diagnosis not present

## 2024-02-23 DIAGNOSIS — C7971 Secondary malignant neoplasm of right adrenal gland: Secondary | ICD-10-CM | POA: Insufficient documentation

## 2024-02-23 DIAGNOSIS — G893 Neoplasm related pain (acute) (chronic): Secondary | ICD-10-CM | POA: Diagnosis not present

## 2024-02-23 DIAGNOSIS — Z87891 Personal history of nicotine dependence: Secondary | ICD-10-CM | POA: Diagnosis not present

## 2024-02-23 DIAGNOSIS — R530 Neoplastic (malignant) related fatigue: Secondary | ICD-10-CM

## 2024-02-23 DIAGNOSIS — Z803 Family history of malignant neoplasm of breast: Secondary | ICD-10-CM | POA: Insufficient documentation

## 2024-02-23 DIAGNOSIS — C20 Malignant neoplasm of rectum: Secondary | ICD-10-CM | POA: Diagnosis not present

## 2024-02-23 DIAGNOSIS — Z7189 Other specified counseling: Secondary | ICD-10-CM

## 2024-02-23 MED ORDER — DEXAMETHASONE 4 MG PO TABS: 8.0000 mg | ORAL_TABLET | Freq: Every day | ORAL | 1 refills | Status: DC

## 2024-02-23 MED ORDER — HYDROMORPHONE HCL 2 MG PO TABS: 2.0000 mg | ORAL_TABLET | ORAL | 0 refills | Status: DC

## 2024-02-23 MED ORDER — FENTANYL 12 MCG/HR TD PT72: 1.0000 | MEDICATED_PATCH | TRANSDERMAL | 0 refills | Status: DC

## 2024-02-23 MED ORDER — HYDROMORPHONE HCL 2 MG PO TABS: 1.0000 mg | ORAL_TABLET | ORAL | 0 refills | Status: DC | PRN

## 2024-02-23 MED ORDER — LIDOCAINE-PRILOCAINE 2.5-2.5 % EX CREA: TOPICAL_CREAM | CUTANEOUS | 3 refills | Status: DC

## 2024-02-23 MED ORDER — PROCHLORPERAZINE MALEATE 10 MG PO TABS: 10.0000 mg | ORAL_TABLET | Freq: Four times a day (QID) | ORAL | 1 refills | Status: DC | PRN

## 2024-02-23 MED ORDER — ONDANSETRON HCL 8 MG PO TABS: 8.0000 mg | ORAL_TABLET | Freq: Three times a day (TID) | ORAL | 1 refills | Status: DC | PRN

## 2024-02-23 NOTE — Progress Notes (Signed)
 Outbound call to [facility] in [place] [phone] to verify the following medications are in stock at their location: dilaudid 1mg  q 4 hrs as needed qty 90 0 refils fental 12mcg patch.  Spoke to [facility] who indicated fentanyl 12mcg in stock. Stated Dilaudid 1mg  not available but if written to take 1/2 of a 2mg  tablet they will process order.

## 2024-02-23 NOTE — Progress Notes (Signed)
New patient hospital follow up.

## 2024-02-23 NOTE — Telephone Encounter (Signed)
 [facility] requested on specimen  [phone], liver, collected 02/19/2024.

## 2024-02-23 NOTE — Patient Instructions (Signed)
 VISIT SUMMARY:  Today, we discussed your diagnosis of metastatic rectal cancer and the plan to start chemotherapy. We also addressed your pain management and nausea symptoms.  YOUR PLAN:  -METASTATIC RECTAL CANCER (STAGE IV): This is an advanced stage of cancer that has spread from the rectum to other parts of the body, including the liver, adrenal glands, and lymph nodes. The goal of treatment is to reduce the cancer burden and improve your symptoms. You will start FOLFOX plus Avastin chemotherapy next Friday. We will arrange for a port placement early next week and schedule a chemotherapy class. We will also send a tissue specimen for next-generation sequencing and RAS mutation testing, and plan for genetic testing for colon cancer on Friday. Your CEA levels will be monitored, and scans will be performed every three months to adjust treatment as needed.  -CANCER-RELATED PAIN: You are experiencing severe pain due to the cancer. To manage this, you will start using a fentanyl patch, 12 micrograms, every three days. We will also refill your Dilaudid prescription with a two-week supply. Your pain control will be monitored, and medications will be adjusted as needed.  -NAUSEA DUE TO MALIGNANCY: Nausea is likely due to the extensive involvement of the liver by the cancer and may worsen with chemotherapy. You will have nausea medications available as needed, and we have provided guidance on potential chemotherapy-induced nausea and vomiting.  INSTRUCTIONS:  Please follow up for port placement early next week and attend the chemotherapy class as scheduled. Start your FOLFOX plus Avastin chemotherapy next Friday. Plan for genetic testing for colon cancer on Friday. Monitor your pain and nausea, and use the prescribed medications as directed. We will monitor your CEA levels and perform scans every three months to adjust your treatment as needed.

## 2024-02-23 NOTE — Progress Notes (Signed)
 START ON PATHWAY REGIMEN - Colorectal     A cycle is every 14 days:     Bevacizumab-xxxx      Oxaliplatin      Leucovorin      Fluorouracil      Fluorouracil   **Always confirm dose/schedule in your pharmacy ordering system**  Patient Characteristics: Distant Metastases, Nonsurgical Candidate, Non-KRAS G12C, RAS Mutation Positive/Unknown (BRAF V600 Wild-Type/Unknown), Standard Cytotoxic Therapy, First Line Standard Cytotoxic Therapy, Bevacizumab Eligible, PS = 0,1 Tumor Location: Rectal Therapeutic Status: Distant Metastases Microsatellite/Mismatch Repair Status: Unknown BRAF Mutation Status: Awaiting Test Results KRAS/NRAS Mutation Status: Awaiting Test Results Preferred Therapy Approach: Standard Cytotoxic Therapy Standard Cytotoxic Line of Therapy: First Line Standard Cytotoxic Therapy ECOG Performance Status: 1 Bevacizumab Eligibility: Eligible Intent of Therapy: Non-Curative / Palliative Intent, Discussed with Patient

## 2024-02-25 ENCOUNTER — Encounter: Payer: Self-pay | Admitting: Oncology

## 2024-02-25 ENCOUNTER — Other Ambulatory Visit: Payer: Self-pay

## 2024-02-25 NOTE — Progress Notes (Signed)
 Hematology/Oncology Consult note [facility]  [phone] [phone]  Patient Care Team: Patient, No Pcp Per as PCP - General (General Practice) Maurie Rayfield BIRCH, RN as Oncology Nurse Navigator Melanee Annah BROCKS, MD as Consulting Physician (Oncology)   Name of the patient: Margerie Zelek  [id number]  1966-04-30   Date of visit: 02/25/24  Diagnosis-  Cancer Staging  Colorectal cancer, stage IV [facility]) Staging form: Colon and Rectum, AJCC 8th Edition - Clinical stage from 02/23/2024: Stage IVC (cTX, cN2b, pM1c) - Signed by Melanee Annah BROCKS, MD on 02/25/2024    Chief complaint/ Reason for visit- discuss pathology results and further management  Heme/Onc history: patient is a 58 year old female with a past medical history significant for hypertension who presented with symptoms of epigastric and right upper quadrant abdominal pain which has been ongoing for 2 weeks.She underwent CT abdomen pelvis with contrast in the ER which showed significant thoracic adenopathy, multiple hepatic masses.  Left greater than right adrenal nodules.  There was evidence of intra-abdominal adenopathy including index gastrohepatic lymph node measuring 2.2 cm.  Extensive adenopathy within the sigmoid mesocolon with a nodal mass of 3.8 x 4.2 cm.  Findings concerning for rectosigmoid primary given the eccentric right wall thickening.   CT angio chest did not show any evidence of large central pulmonary embolus or subsegmental pulmonary embolism.  Bulky mediastinal and bilateral hilar adenopathy  Interval history- Discussed the use of AI scribe software for clinical note transcription with the patient, who gave verbal consent to proceed.  Ekta C Stonebraker is a 58 year old female with metastatic colorectal cancer who presents for chemotherapy initiation.   She experiences significant pain, managed with Dilaudid 1 mg every five hours. The pain returns after this period, causing  her to wake up at night. She was previously on Percocet, which was ineffective. She has been on Dilaudid for about five to six days.  She experiences nausea. She is not currently on any blood thinners or aspirin. Her CEA level was noted to be 388.  Her current medications include Dilaudid 1 mg every five hours for pain, and she will be starting a fentanyl patch for continuous pain management. She will also have nausea medications available as needed.  She works from home on Fridays and is considering short-term disability due to her current symptoms. No blood in her stool prior to the diagnosis.       ECOG PS- 1 Pain scale- 5 Opioid associated constipation- no  Review of systems- Review of Systems  Constitutional:  Positive for malaise/fatigue. Negative for chills, fever and weight loss.  HENT:  Negative for congestion, ear discharge and nosebleeds.   Eyes:  Negative for blurred vision.  Respiratory:  Negative for cough, hemoptysis, sputum production, shortness of breath and wheezing.   Cardiovascular:  Negative for chest pain, palpitations, orthopnea and claudication.  Gastrointestinal:  Positive for abdominal pain and nausea. Negative for blood in stool, constipation, diarrhea, heartburn, melena and vomiting.  Genitourinary:  Negative for dysuria, flank pain, frequency, hematuria and urgency.  Musculoskeletal:  Negative for back pain, joint pain and myalgias.  Skin:  Negative for rash.  Neurological:  Negative for dizziness, tingling, focal weakness, seizures, weakness and headaches.  Endo/Heme/Allergies:  Does not bruise/bleed easily.  Psychiatric/Behavioral:  Negative for depression and suicidal ideas. The patient does not have insomnia.       Allergies  Allergen Reactions   Penicillins Other (See Comments)    joints lock up  Joints lock becomes immobile, joint pain     Past Medical History:  Diagnosis Date   Ectopic pregnancy    Hypertension    Miscarriage       Past Surgical History:  Procedure Laterality Date   CYST REMOVAL HAND Right    LIVER BIOPSY     SALPINGECTOMY     Etopic Pregnancy    Social History   Socioeconomic History   Marital status: Married    Spouse name: Ozell   Number of children: 3   Years of education: Not on file   Highest education level: Not on file  Occupational History   Not on file  Tobacco Use   Smoking status: Former    Types: Cigarettes   Smokeless tobacco: Never  Vaping Use   Vaping status: Former  Substance and Sexual Activity   Alcohol use: No   Drug use: No   Sexual activity: Yes  Other Topics Concern   Not on file  Social History Narrative   Not on file   Social Drivers of Health   Financial Resource Strain: Not on file  Food Insecurity: No Food Insecurity (02/23/2024)   Hunger Vital Sign    Worried About Running Out of Food in the Last Year: Never true    Ran Out of Food in the Last Year: Never true  Transportation Needs: No Transportation Needs (02/23/2024)   PRAPARE - Administrator, Civil Service (Medical): No    Lack of Transportation (Non-Medical): No  Physical Activity: Not on file  Stress: Not on file  Social Connections: Not on file  Intimate Partner Violence: Not At Risk (02/23/2024)   Humiliation, Afraid, Rape, and Kick questionnaire    Fear of Current or Ex-Partner: No    Emotionally Abused: No    Physically Abused: No    Sexually Abused: No    Family History  Problem Relation Age of Onset   Aneurysm Mother    Hypertension Mother    Heart Problems Father    Breast cancer Sister 57   COPD Half-Sister      Current Outpatient Medications:    lisinopril-hydrochlorothiazide (ZESTORETIC) 20-12.5 MG tablet, Take 1 tablet by mouth daily., Disp: 30 tablet, Rfl: 2   polyethylene glycol (MIRALAX) 17 g packet, Take 17 g by mouth 2 (two) times daily., Disp: 180 packet, Rfl: 0   dexamethasone (DECADRON) 4 MG tablet, Take 2 tablets (8 mg total) by mouth  daily. Start the day after chemotherapy for 2 days. Take with food., Disp: 30 tablet, Rfl: 1   fentaNYL (DURAGESIC) 12 MCG/HR, Place 1 patch onto the skin every 3 (three) days., Disp: 10 patch, Rfl: 0   HYDROmorphone (DILAUDID) 2 MG tablet, Take 1 tablet (2 mg total) by mouth every 3 (three) hours., Disp: 90 tablet, Rfl: 0   lidocaine-prilocaine (EMLA) cream, Apply to affected area once, Disp: 30 g, Rfl: 3   ondansetron (ZOFRAN) 8 MG tablet, Take 1 tablet (8 mg total) by mouth every 8 (eight) hours as needed for nausea or vomiting. Start on the third day after chemotherapy., Disp: 30 tablet, Rfl: 1   prochlorperazine (COMPAZINE) 10 MG tablet, Take 1 tablet (10 mg total) by mouth every 6 (six) hours as needed for nausea or vomiting., Disp: 30 tablet, Rfl: 1  Physical exam:  Vitals:   02/23/24 1450  BP: (!) 126/92  Pulse: (!) 101  Resp: 20  Temp: 98 F (36.7 C)  TempSrc: Tympanic  SpO2: 96%  Weight: ROLLEN)  310 lb 6.4 oz (140.8 kg)  Height: 5' 10 (1.778 m)   Physical Exam Cardiovascular:     Rate and Rhythm: Normal rate and regular rhythm.     Heart sounds: Normal heart sounds.  Pulmonary:     Effort: Pulmonary effort is normal.     Breath sounds: Normal breath sounds.  Skin:    General: Skin is warm and dry.  Neurological:     Mental Status: She is alert and oriented to person, place, and time.      I have personally reviewed labs listed below:    Latest Ref Rng & Units 02/20/2024    7:07 PM  CMP  Glucose 70 - 99 mg/dL 98   BUN 6 - 20 mg/dL 16   Creatinine 9.55 - 1.00 mg/dL 9.47   Sodium 864 - 854 mmol/L 135   Potassium 3.5 - 5.1 mmol/L 3.6   Chloride 98 - 111 mmol/L 94   CO2 22 - 32 mmol/L 27   Calcium 8.9 - 10.3 mg/dL 9.6   Total Protein 6.5 - 8.1 g/dL 7.5   Total Bilirubin 0.0 - 1.2 mg/dL 0.7   Alkaline Phos 38 - 126 U/L 127   AST 15 - 41 U/L 39   ALT 0 - 44 U/L 20       Latest Ref Rng & Units 02/20/2024    7:07 PM  CBC  WBC 4.0 - 10.5 K/uL 13.7   Hemoglobin  12.0 - 15.0 g/dL 87.6   Hematocrit 63.9 - 46.0 % 39.3   Platelets 150 - 400 K/uL 436    I have personally reviewed Radiology images listed below:   CT ABDOMEN PELVIS W CONTRAST Result Date: 02/21/2024 CLINICAL DATA:  History of liver biopsy yesterday with persistent upper abdominal pain, initial encounter EXAM: CT ABDOMEN AND PELVIS WITH CONTRAST TECHNIQUE: Multidetector CT imaging of the abdomen and pelvis was performed using the standard protocol following bolus administration of intravenous contrast. RADIATION DOSE REDUCTION: This exam was performed according to the departmental dose-optimization program which includes automated exposure control, adjustment of the mA and/or kV according to patient size and/or use of iterative reconstruction technique. CONTRAST:  OMNIPAQUE IOHEXOL 350 MG/ML SOLN COMPARISON:  02/18/2024 FINDINGS: Lower chest: Lung bases are free of acute infiltrate. Mediastinal and hilar adenopathy is again similar to that seen on recent exam. Hepatobiliary: Liver is well visualized and demonstrates multiple scattered hypodensities throughout the liver. These appear worst in the left lobe with changes of very mild ductal dilatation in the left lobe. This is similar to that seen on recent exam. The gallbladder is partially distended. Pancreas: Unremarkable. No pancreatic ductal dilatation or surrounding inflammatory changes. Spleen: Normal in size without focal abnormality. Adrenals/Urinary Tract: Adrenal nodules are again identified bilaterally unchanged from the exam 3 days previous. Kidneys demonstrate a normal enhancement pattern. Peripelvic cyst is again identified in the upper pole of the right kidney stable from the prior exam. No renal calculi or obstructive changes are seen. The bladder is within normal limits. Stomach/Bowel: No obstructive or inflammatory changes of the colon are noted. There is persistent wall thickening identified in the rectum but less well visualized.  Some eccentric wall thickening is noted in the rectosigmoid stable from the prior exam. The appendix is within normal limits. Small bowel and stomach appear within normal limits. Vascular/Lymphatic: No aneurysmal dilatation is noted. Mild atherosclerotic calcifications are seen. Extensive lymphadenopathy is identified in the periaortic region of the upper abdomen as well as in the  intra-aortocaval region. Soft tissue masses are noted adjacent to the sigmoid colon best seen on image number 73 of series 2 stable from the prior exam. Reproductive: Uterus appears within normal limits. Other: No free fluid is noted.  No herniation is seen. Musculoskeletal: No acute bony abnormality is noted. IMPRESSION: Extensive changes consistent with the known history of metastatic disease. These changes are stable in the interval from 2 days previous. This is likely of colonic origin given the changes in the rectosigmoid and rectum. Biopsy results from the liver are pending. Mild left biliary ductal dilatation related to the underlying hepatic metastatic disease. This is stable from the prior exam. No significant ascites. No findings to suggest post biopsy hemorrhage or other complication. Electronically Signed   By: Oneil Devonshire M.D.   On: 02/21/2024 00:31   US  BIOPSY (LIVER) Result Date: 02/19/2024 INDICATION: Masses of liver and lymphadenopathy and pulmonary nodules in the chest. The patient presents for liver biopsy. EXAM: ULTRASOUND GUIDED CORE BIOPSY OF LIVER MEDICATIONS: None. ANESTHESIA/SEDATION: Moderate (conscious) sedation was employed during this procedure. A total of Versed 1.0 mg and Fentanyl 50 mcg was administered intravenously. Moderate Sedation Time: 13 minutes. The patient's level of consciousness and vital signs were monitored continuously by radiology nursing throughout the procedure under my direct supervision. PROCEDURE: The procedure, risks, benefits, and alternatives were explained to the patient.  Questions regarding the procedure were encouraged and answered. The patient understands and consents to the procedure. A time out was performed prior to initiating the procedure. The abdominal wall was prepped with chlorhexidine in a sterile fashion, and a sterile drape was applied covering the operative field. A sterile gown and sterile gloves were used for the procedure. Local anesthesia was provided with 1% Lidocaine. Ultrasound was performed of the liver to localize lesions. A site was chosen over the anterior abdominal wall for biopsy. Under ultrasound guidance, a 17 gauge trocar needle was advanced into the left lobe of the liver. After confirming needle tip position, 3 separate coaxial 18 gauge core biopsy samples were obtained of a left lobe liver lesion. Samples were submitted in formalin. A slurry of Gel-Foam EmboCubes mixed with sterile saline was then injected as the outer needle was retracted and removed. Additional ultrasound was performed COMPLICATIONS: None immediate. FINDINGS: Large ill-defined hyperechoic mass in the left lobe of the liver measuring roughly 12 cm and smaller right lobe mass were localized by ultrasound. The left lobe lesion was chosen for biopsy. Solid tissue was obtained. IMPRESSION: Ultrasound-guided core biopsy performed of a large mass in the left lobe of the liver measuring up to roughly 12 cm. Electronically Signed   By: Marcey Moan M.D.   On: 02/19/2024 13:09   CT Angio Chest PE W and/or Wo Contrast Result Date: 02/18/2024 CLINICAL DATA:  Shortness of breath. Metastatic disease identified on abdomen and pelvis CT earlier today. * Tracking Code: BO * EXAM: CT ANGIOGRAPHY CHEST WITH CONTRAST TECHNIQUE: Multidetector CT imaging of the chest was performed using the standard protocol during bolus administration of intravenous contrast. Multiplanar CT image reconstructions and MIPs were obtained to evaluate the vascular anatomy. RADIATION DOSE REDUCTION: This exam was  performed according to the departmental dose-optimization program which includes automated exposure control, adjustment of the mA and/or kV according to patient size and/or use of iterative reconstruction technique. CONTRAST:  75mL OMNIPAQUE IOHEXOL 350 MG/ML SOLN COMPARISON:  None Available. FINDINGS: Cardiovascular: Heart size mildly enlarged. No substantial pericardial effusion. Mild atherosclerotic calcification is noted in the wall  of the thoracic aorta. Enlargement of the pulmonary outflow tract/main pulmonary arteries suggests pulmonary arterial hypertension. No large central pulmonary embolus in the main or lobar pulmonary arteries. No definite segmental or subsegmental pulmonary embolus evident although assessment is limited by bolus timing and evaluation of these arteries may be unreliable. Mediastinum/Nodes: Bulky mediastinal lymphadenopathy including a 2.7 cm short axis right paratracheal lymph node and 2.5 cm short axis prevascular lymph node. There is bilateral hilar lymphadenopathy. The esophagus has normal imaging features. There is no axillary lymphadenopathy. Lungs/Pleura: Rr 18 mm parahilar right lung nodule identified on 65/6. 7 mm right lower lobe pulmonary nodule identified on 72/6. Upper Abdomen: See report for abdomen/pelvis CT performed immediately prior to this study and dictated separately. Musculoskeletal: No worrisome lytic or sclerotic osseous abnormality. Review of the MIP images confirms the above findings. IMPRESSION: 1. No large central pulmonary embolus in the main or lobar pulmonary arteries. No definite segmental or subsegmental pulmonary embolus evident although assessment is limited by bolus timing and evaluation of these arteries may be unreliable. 2. Bulky mediastinal and bilateral hilar lymphadenopathy. Imaging features are compatible with metastatic disease. 3. 18 mm parahilar right lung nodule with 7 mm right lower lobe pulmonary nodule. Imaging features are compatible  with metastatic disease. 4. Enlargement of the pulmonary outflow tract/main pulmonary arteries suggests pulmonary arterial hypertension. 5.  Aortic Atherosclerosis (ICD10-I70.0). Electronically Signed   By: Camellia Candle M.D.   On: 02/18/2024 13:50   CT ABDOMEN PELVIS W CONTRAST Result Date: 02/18/2024 CLINICAL DATA:  Shortness of breath. Diarrhea. Right flank pain. * Tracking Code: BO * EXAM: CT ABDOMEN AND PELVIS WITH CONTRAST TECHNIQUE: Multidetector CT imaging of the abdomen and pelvis was performed using the standard protocol following bolus administration of intravenous contrast. RADIATION DOSE REDUCTION: This exam was performed according to the departmental dose-optimization program which includes automated exposure control, adjustment of the mA and/or kV according to patient size and/or use of iterative reconstruction technique. CONTRAST:  100mL OMNIPAQUE IOHEXOL 300 MG/ML  SOLN COMPARISON:  None Available. FINDINGS: Lower chest: Mild cardiomegaly, without pericardial or pleural effusion. Marked lower thoracic adenopathy, with a node in the azygoesophageal recess measuring 2.5 x 3.3 cm on 01/02. Hepatobiliary: Hepatomegaly at 27.1 cm craniocaudal. Irregular hepatic capsule which is most likely due multiple hepatic masses. The dominant segment 4 B mass measures 10.1 x 9.4 cm on 35/2 and causes upstream mild intrahepatic duct dilatation in segment 2 on image 28/2. Infiltrative right-sided masses including at 7.8 x 5.3 cm on 47/2. Normal gallbladder, without biliary ductal dilatation. Pancreas: Normal, without mass or ductal dilatation. Spleen: Normal in size, without focal abnormality. Adrenals/Urinary Tract: Left larger than right adrenal nodules. Example on the left at 3.3 cm and 8 HU. On the right at 1.8 cm and 15 HU. Upper pole right renal sinus cyst of 2.3 cm. Normal left kidney. No hydronephrosis. Normal urinary bladder. Stomach/Bowel: Normal stomach, without wall thickening. Eccentric right left  rectal wall thickening on image 84/2. more significant eccentric right rectosigmoid wall thickening including on 76/2. Example 2.6 x 3.0 cm. Extensive colonic diverticulosis. Colonic stool burden suggests constipation. Normal terminal ileum and appendix. Normal small bowel. Vascular/Lymphatic: Aortic atherosclerosis. Significant abdominal retroperitoneal adenopathy. Index gastrohepatic ligament node measures 2.2 cm on 21/2. No pelvic sidewall adenopathy. There is extensive adenopathy within the sigmoid mesocolon with a nodal mass measuring 3.8 x 4.2 cm on 70/2. Reproductive: Normal uterus and adnexa. Other: Trace perihepatic ascites. No evidence of omental or peritoneal disease. Musculoskeletal: No acute  osseous abnormality. IMPRESSION: 1. Constellation of findings which are consistent with widespread metastatic disease. Massive lower thoracic, mild-to-moderate abdominal, and moderate pelvic adenopathy. Infiltrative bilateral hepatic masses. 2. The primary is most likely in the rectum or rectosigmoid colon, with areas of eccentric right wall thickening as detailed above. 3. Possible constipation. No evidence of high-grade bowel obstruction. 4. Left larger than right adrenal lesions. The left-sided lesion is consistent with an adenoma. The right-sided lesion is technically indeterminate but favored to represent an adenoma. 5. Segment 2 intrahepatic biliary duct dilatation secondary to the dominant segment 4A mass. Consider correlation with bilirubin levels. 6. Trace perihepatic ascites. 7. Incidental findings, including: Cardiomegaly. Aortic Atherosclerosis (ICD10-I70.0). Electronically Signed   By: Rockey Kilts M.D.   On: 02/18/2024 12:50   DG Chest 2 View Result Date: 02/18/2024 CLINICAL DATA:  Shortness of breath and epigastric pain. EXAM: CHEST - 2 VIEW COMPARISON:  None Available. FINDINGS: The heart size and mediastinal contours are within normal limits. No acute infiltrate, pleural effusion or pneumothorax  identified. The visualized skeletal structures are unremarkable. IMPRESSION: No active cardiopulmonary disease. Electronically Signed   By: Suzen Dials M.D.   On: 02/18/2024 10:31     Assessment and plan- Patient is a 58 y.o. 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 Not curable due to widespread metastasis. Goal: reduce cancer burden, improve symptoms. Expected survival: 2.5-3 years. - Start FOLFOX plus Avastin chemotherapy next Friday.  Discussed risks and benefits of chemotherapy including all but not limited to nausea vomiting low blood counts risk of infections and hospitalization.  Risk of peripheral neuropathy associated with oxaliplatin.  Risk of thromboembolism hypertension proteinuria and bleeding risk associated with a Avastin.  Treatment will be given with a palliative intent.  Patient understands and agrees to proceed as planned - Arrange port placement early next week. - Schedule chemotherapy class. - Send tissue specimen for next-generation sequencing and RAS mutation testing. - Plan genetic testing for colon cancer on Friday. - Monitor CEA levels and perform scans every three months. - Adjust treatment based on NGS testing if RAS inhibitors are needed instead of avastin given left sided primary tumor versus immunotherapy if MSI unstable.  Given the extensive burden of disease I am getting started with chemotherapy regardless  Cancer-related pain Severe pain due to high disease burden, managed with Dilaudid. Considering long-acting pain medication for consistent control. - Prescribe fentanyl patch, 12 micrograms, every three days. - Refill Dilaudid prescription with a two-week  supply. - Monitor pain control and adjust medication as needed.  Nausea due to malignancy Nausea likely from extensive liver involvement, intermittent but may worsen with chemotherapy. - Prescribe nausea medications as needed. - Provide anticipatory guidance on potential chemotherapy-induced nausea and vomiting.      Cancer Staging  Colorectal cancer, stage IV [facility]) Staging form: Colon and Rectum, AJCC 8th Edition - Clinical stage from 02/23/2024: Stage IVC (cTX, cN2b, pM1c) - Signed by Melanee Annah BROCKS, MD on 02/25/2024      Visit Diagnosis 1. Colorectal cancer, stage IV (HCC)   2. Goals of care, counseling/discussion   3. Neoplasm related pain      Dr. Annah Melanee, MD, MPH [facility] [phone] 02/25/2024 2:53 PM

## 2024-02-26 ENCOUNTER — Telehealth: Payer: Self-pay

## 2024-02-26 ENCOUNTER — Encounter: Payer: Self-pay | Admitting: Oncology

## 2024-02-26 NOTE — Progress Notes (Signed)
 Patient for IR Port Placement on Tues 02/27/24, I called and spoke with the patient on the phone and gave pre-procedure instructions. Pt was made aware to be here at 1p, NPO after MN prior to procedure as well as driver post procedure/recovery/discharge. Pt stated understanding. Called 02/26/24

## 2024-02-26 NOTE — Telephone Encounter (Signed)
 Called Melissa Johns with appointment instructions for port following chemo education class.  Port a cath placement scheduled for February 27, 2024  Arrive at 1200  for 1300 appointment Come into the Heart and Vascular entrance at [facility]. This entrance is located in the front of the hospital Do not eat or drink anything after midnight Take your regularly scheduled blood pressure, pain, or seizure medication You do not need to hold you blood thinners You will need a driver for this procedure

## 2024-02-27 ENCOUNTER — Other Ambulatory Visit: Payer: Self-pay

## 2024-02-27 ENCOUNTER — Other Ambulatory Visit: Payer: Self-pay | Admitting: Oncology

## 2024-02-27 ENCOUNTER — Ambulatory Visit
Admission: RE | Admit: 2024-02-27 | Discharge: 2024-02-27 | Disposition: A | Discharge status: Home / Self Care | Source: Ambulatory Visit | Attending: Oncology | Admitting: Oncology

## 2024-02-27 ENCOUNTER — Other Ambulatory Visit: Payer: Self-pay | Admitting: Licensed Clinical Social Worker

## 2024-02-27 ENCOUNTER — Inpatient Hospital Stay

## 2024-02-27 ENCOUNTER — Encounter: Payer: Self-pay | Admitting: Radiology

## 2024-02-27 ENCOUNTER — Other Ambulatory Visit: Payer: Self-pay | Admitting: *Deleted

## 2024-02-27 ENCOUNTER — Encounter: Payer: Self-pay | Admitting: Oncology

## 2024-02-27 DIAGNOSIS — Z1379 Encounter for other screening for genetic and chromosomal anomalies: Secondary | ICD-10-CM

## 2024-02-27 DIAGNOSIS — C19 Malignant neoplasm of rectosigmoid junction: Secondary | ICD-10-CM | POA: Insufficient documentation

## 2024-02-27 HISTORY — PX: IR IMAGING GUIDED PORT INSERTION: IMG5740

## 2024-02-27 MED ORDER — LIDOCAINE-EPINEPHRINE 1 %-1:100000 IJ SOLN
20.0000 mL | Freq: Once | INTRAMUSCULAR | Status: AC
Start: 2024-02-27 — End: 2024-02-27
  Administered 2024-02-27: 20 mL via INTRADERMAL

## 2024-02-27 MED ORDER — MIDAZOLAM HCL 5 MG/5ML IJ SOLN
INTRAMUSCULAR | Status: AC | PRN
  Administered 2024-02-27 (×2): 1 mg via INTRAVENOUS

## 2024-02-27 MED ORDER — LIDOCAINE-EPINEPHRINE 1 %-1:100000 IJ SOLN
INTRAMUSCULAR | Status: AC
  Filled 2024-02-27: qty 1

## 2024-02-27 MED ORDER — HEPARIN SOD (PORK) LOCK FLUSH 100 UNIT/ML IV SOLN
INTRAVENOUS | Status: AC
  Filled 2024-02-27: qty 5

## 2024-02-27 MED ORDER — HEPARIN SOD (PORK) LOCK FLUSH 100 UNIT/ML IV SOLN
500.0000 [IU] | Freq: Once | INTRAVENOUS | Status: AC
  Administered 2024-02-27: 500 [IU] via INTRAVENOUS

## 2024-02-27 MED ORDER — SODIUM CHLORIDE 0.9 % IV SOLN: INTRAVENOUS | Status: DC

## 2024-02-27 MED ORDER — FENTANYL CITRATE (PF) 100 MCG/2ML IJ SOLN
INTRAMUSCULAR | Status: AC
  Filled 2024-02-27: qty 2

## 2024-02-27 MED ORDER — HYDROCODONE BIT-HOMATROP MBR 5-1.5 MG/5ML PO SOLN: 5.0000 mL | Freq: Four times a day (QID) | ORAL | 0 refills | Status: DC | PRN

## 2024-02-27 MED ORDER — MIDAZOLAM HCL 2 MG/2ML IJ SOLN
INTRAMUSCULAR | Status: AC
  Filled 2024-02-27: qty 2

## 2024-02-27 MED ORDER — FENTANYL CITRATE (PF) 100 MCG/2ML IJ SOLN
INTRAMUSCULAR | Status: AC | PRN
  Administered 2024-02-27 (×2): 50 ug via INTRAVENOUS

## 2024-02-27 NOTE — H&P (Signed)
 Chief Complaint:  Stage IV Colorectal Cancer  Procedure: Port-a-catheter insertion  Referring Provider(s): Dr. Annah Skene  Supervising Physician: Hughes Simmonds  Patient Status: [facility] - Out-pt  History of Present Illness: Melissa Johns is a 58 y.o. female with a history of progressively worsening abdominal pain noted at the beginning of October. 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 Patient is following with Dr. Skene and has elected to undergo chemotherapy with the goal of reducing cancer burden and improving symptoms. Patient presents today for her port-a-catheter insertion.   Patient is resting in bed in no acute distress. Admits to continued abdominal pain, worse in the RUQ. Continues to take pain meds, Dilaudid and fentanyl patches, for abdominal pain relief. Denies any fevers/chills, chest pain, worsening shortness of breath, changes in bowel movements, or blood in her stool. NPO since ~630am. All questions and concerns answered at the bedside.   Patient is Full Code  Past Medical History:  Diagnosis Date   Ectopic pregnancy    Hypertension    Miscarriage     Past Surgical History:  Procedure Laterality Date   CYST REMOVAL HAND Right    LIVER BIOPSY     SALPINGECTOMY     Etopic Pregnancy    Allergies: Penicillins  Medications: Prior to Admission medications   Medication Sig Start Date End Date Taking? Authorizing Provider  dexamethasone (DECADRON) 4 MG tablet Take 2 tablets (8 mg total) by mouth daily. Start the day after chemotherapy for 2 days. Take with food. 02/23/24   Rao, Archana C, MD  fentaNYL (DURAGESIC) 12 MCG/HR Place 1 patch onto the skin every 3 (three) days. 02/23/24   Finnegan, Timothy J, MD  HYDROcodone bit-homatropine (HYCODAN) 5-1.5 MG/5ML syrup Take 5 mLs by mouth every 6 (six) hours as needed for cough. 02/27/24   Rao, Archana  C, MD  HYDROmorphone (DILAUDID) 2 MG tablet Take 1 tablet (2 mg total) by mouth every 3 (three) hours. 02/23/24   Finnegan, Timothy J, MD  lidocaine-prilocaine (EMLA) cream Apply to affected area once 02/23/24   Rao, Archana C, MD  lisinopril-hydrochlorothiazide (ZESTORETIC) 20-12.5 MG tablet Take 1 tablet by mouth daily. 02/18/24 02/17/25  Laurita Cort DASEN, MD  ondansetron (ZOFRAN) 8 MG tablet Take 1 tablet (8 mg total) by mouth every 8 (eight) hours as needed for nausea or vomiting. Start on the third day after chemotherapy. 02/23/24   Rao, Archana C, MD  polyethylene glycol (MIRALAX) 17 g packet Take 17 g by mouth 2 (two) times daily. 02/21/24 05/21/24  Cyrena Mylar, MD  prochlorperazine (COMPAZINE) 10 MG tablet Take 1 tablet (10 mg total) by mouth every 6 (six) hours as needed for nausea or vomiting. 02/23/24   Skene Annah BROCKS, MD     Family History  Problem Relation Age of Onset   Aneurysm Mother    Hypertension Mother    Heart Problems Father    Breast cancer Sister 62   COPD Half-Sister     Social History   Socioeconomic History   Marital status: Married    Spouse name: Ozell   Number of children: 3   Years of education: Not on file   Highest education level: Not on file  Occupational History   Not on file  Tobacco Use   Smoking status: Former    Types: Cigarettes   Smokeless tobacco: Never  Vaping Use   Vaping status: Former  Quit date: 02/13/2024  Substance and Sexual Activity   Alcohol use: No   Drug use: No   Sexual activity: Yes  Other Topics Concern   Not on file  Social History Narrative   Not on file   Social Drivers of Health   Financial Resource Strain: Not on file  Food Insecurity: No Food Insecurity (02/23/2024)   Hunger Vital Sign    Worried About Running Out of Food in the Last Year: Never true    Ran Out of Food in the Last Year: Never true  Transportation Needs: No Transportation Needs (02/23/2024)   PRAPARE - Scientist, Research (physical Sciences) (Medical): No    Lack of Transportation (Non-Medical): No  Physical Activity: Not on file  Stress: Not on file  Social Connections: Not on file    Review of Systems Patient denies any headache, chest pain, shortness of breath, abdominal pain, N/V, or fever/chills. All other systems are negative.   Vital Signs: BP 113/68   Pulse 87   Temp 97.8 F (36.6 C) (Temporal)   Resp 19   Ht 5' 10 (1.778 m)   Wt (!) 308 lb 9.6 oz (140 kg)   LMP 12/21/2015   SpO2 93%   BMI 44.28 kg/m    Physical Exam Constitutional:      Appearance: She is obese.  HENT:     Mouth/Throat:     Mouth: Mucous membranes are moist.     Pharynx: Oropharynx is clear.  Cardiovascular:     Rate and Rhythm: Normal rate and regular rhythm.     Heart sounds: Normal heart sounds.  Pulmonary:     Effort: Pulmonary effort is normal.     Breath sounds: Normal breath sounds.  Abdominal:     General: Abdomen is flat.     Palpations: Abdomen is soft.     Tenderness: There is abdominal tenderness (RUQ and epigastric).  Skin:    General: Skin is warm and dry.  Neurological:     Mental Status: She is alert and oriented to person, place, and time.  Psychiatric:        Behavior: Behavior normal.     Imaging: CT ABDOMEN PELVIS W CONTRAST Result Date: 02/21/2024 CLINICAL DATA:  History of liver biopsy yesterday with persistent upper abdominal pain, initial encounter EXAM: CT ABDOMEN AND PELVIS WITH CONTRAST TECHNIQUE: Multidetector CT imaging of the abdomen and pelvis was performed using the standard protocol following bolus administration of intravenous contrast. RADIATION DOSE REDUCTION: This exam was performed according to the departmental dose-optimization program which includes automated exposure control, adjustment of the mA and/or kV according to patient size and/or use of iterative reconstruction technique. CONTRAST:  OMNIPAQUE IOHEXOL 350 MG/ML SOLN COMPARISON:  02/18/2024 FINDINGS: Lower  chest: Lung bases are free of acute infiltrate. Mediastinal and hilar adenopathy is again similar to that seen on recent exam. Hepatobiliary: Liver is well visualized and demonstrates multiple scattered hypodensities throughout the liver. These appear worst in the left lobe with changes of very mild ductal dilatation in the left lobe. This is similar to that seen on recent exam. The gallbladder is partially distended. Pancreas: Unremarkable. No pancreatic ductal dilatation or surrounding inflammatory changes. Spleen: Normal in size without focal abnormality. Adrenals/Urinary Tract: Adrenal nodules are again identified bilaterally unchanged from the exam 3 days previous. Kidneys demonstrate a normal enhancement pattern. Peripelvic cyst is again identified in the upper pole of the right kidney stable from the prior exam. No renal calculi  or obstructive changes are seen. The bladder is within normal limits. Stomach/Bowel: No obstructive or inflammatory changes of the colon are noted. There is persistent wall thickening identified in the rectum but less well visualized. Some eccentric wall thickening is noted in the rectosigmoid stable from the prior exam. The appendix is within normal limits. Small bowel and stomach appear within normal limits. Vascular/Lymphatic: No aneurysmal dilatation is noted. Mild atherosclerotic calcifications are seen. Extensive lymphadenopathy is identified in the periaortic region of the upper abdomen as well as in the intra-aortocaval region. Soft tissue masses are noted adjacent to the sigmoid colon best seen on image number 73 of series 2 stable from the prior exam. Reproductive: Uterus appears within normal limits. Other: No free fluid is noted.  No herniation is seen. Musculoskeletal: No acute bony abnormality is noted. IMPRESSION: Extensive changes consistent with the known history of metastatic disease. These changes are stable in the interval from 2 days previous. This is likely of  colonic origin given the changes in the rectosigmoid and rectum. Biopsy results from the liver are pending. Mild left biliary ductal dilatation related to the underlying hepatic metastatic disease. This is stable from the prior exam. No significant ascites. No findings to suggest post biopsy hemorrhage or other complication. Electronically Signed   By: Oneil Devonshire M.D.   On: 02/21/2024 00:31   US  BIOPSY (LIVER) Result Date: 02/19/2024 INDICATION: Masses of liver and lymphadenopathy and pulmonary nodules in the chest. The patient presents for liver biopsy. EXAM: ULTRASOUND GUIDED CORE BIOPSY OF LIVER MEDICATIONS: None. ANESTHESIA/SEDATION: Moderate (conscious) sedation was employed during this procedure. A total of Versed 1.0 mg and Fentanyl 50 mcg was administered intravenously. Moderate Sedation Time: 13 minutes. The patient's level of consciousness and vital signs were monitored continuously by radiology nursing throughout the procedure under my direct supervision. PROCEDURE: The procedure, risks, benefits, and alternatives were explained to the patient. Questions regarding the procedure were encouraged and answered. The patient understands and consents to the procedure. A time out was performed prior to initiating the procedure. The abdominal wall was prepped with chlorhexidine in a sterile fashion, and a sterile drape was applied covering the operative field. A sterile gown and sterile gloves were used for the procedure. Local anesthesia was provided with 1% Lidocaine. Ultrasound was performed of the liver to localize lesions. A site was chosen over the anterior abdominal wall for biopsy. Under ultrasound guidance, a 17 gauge trocar needle was advanced into the left lobe of the liver. After confirming needle tip position, 3 separate coaxial 18 gauge core biopsy samples were obtained of a left lobe liver lesion. Samples were submitted in formalin. A slurry of Gel-Foam EmboCubes mixed with sterile saline was  then injected as the outer needle was retracted and removed. Additional ultrasound was performed COMPLICATIONS: None immediate. FINDINGS: Large ill-defined hyperechoic mass in the left lobe of the liver measuring roughly 12 cm and smaller right lobe mass were localized by ultrasound. The left lobe lesion was chosen for biopsy. Solid tissue was obtained. IMPRESSION: Ultrasound-guided core biopsy performed of a large mass in the left lobe of the liver measuring up to roughly 12 cm. Electronically Signed   By: Marcey Moan M.D.   On: 02/19/2024 13:09   CT Angio Chest PE W and/or Wo Contrast Result Date: 02/18/2024 CLINICAL DATA:  Shortness of breath. Metastatic disease identified on abdomen and pelvis CT earlier today. * Tracking Code: BO * EXAM: CT ANGIOGRAPHY CHEST WITH CONTRAST TECHNIQUE: Multidetector CT imaging of  the chest was performed using the standard protocol during bolus administration of intravenous contrast. Multiplanar CT image reconstructions and MIPs were obtained to evaluate the vascular anatomy. RADIATION DOSE REDUCTION: This exam was performed according to the departmental dose-optimization program which includes automated exposure control, adjustment of the mA and/or kV according to patient size and/or use of iterative reconstruction technique. CONTRAST:  75mL OMNIPAQUE IOHEXOL 350 MG/ML SOLN COMPARISON:  None Available. FINDINGS: Cardiovascular: Heart size mildly enlarged. No substantial pericardial effusion. Mild atherosclerotic calcification is noted in the wall of the thoracic aorta. Enlargement of the pulmonary outflow tract/main pulmonary arteries suggests pulmonary arterial hypertension. No large central pulmonary embolus in the main or lobar pulmonary arteries. No definite segmental or subsegmental pulmonary embolus evident although assessment is limited by bolus timing and evaluation of these arteries may be unreliable. Mediastinum/Nodes: Bulky mediastinal lymphadenopathy including  a 2.7 cm short axis right paratracheal lymph node and 2.5 cm short axis prevascular lymph node. There is bilateral hilar lymphadenopathy. The esophagus has normal imaging features. There is no axillary lymphadenopathy. Lungs/Pleura: Rr 18 mm parahilar right lung nodule identified on 65/6. 7 mm right lower lobe pulmonary nodule identified on 72/6. Upper Abdomen: See report for abdomen/pelvis CT performed immediately prior to this study and dictated separately. Musculoskeletal: No worrisome lytic or sclerotic osseous abnormality. Review of the MIP images confirms the above findings. IMPRESSION: 1. No large central pulmonary embolus in the main or lobar pulmonary arteries. No definite segmental or subsegmental pulmonary embolus evident although assessment is limited by bolus timing and evaluation of these arteries may be unreliable. 2. Bulky mediastinal and bilateral hilar lymphadenopathy. Imaging features are compatible with metastatic disease. 3. 18 mm parahilar right lung nodule with 7 mm right lower lobe pulmonary nodule. Imaging features are compatible with metastatic disease. 4. Enlargement of the pulmonary outflow tract/main pulmonary arteries suggests pulmonary arterial hypertension. 5.  Aortic Atherosclerosis (ICD10-I70.0). Electronically Signed   By: Camellia Candle M.D.   On: 02/18/2024 13:50   CT ABDOMEN PELVIS W CONTRAST Result Date: 02/18/2024 CLINICAL DATA:  Shortness of breath. Diarrhea. Right flank pain. * Tracking Code: BO * EXAM: CT ABDOMEN AND PELVIS WITH CONTRAST TECHNIQUE: Multidetector CT imaging of the abdomen and pelvis was performed using the standard protocol following bolus administration of intravenous contrast. RADIATION DOSE REDUCTION: This exam was performed according to the departmental dose-optimization program which includes automated exposure control, adjustment of the mA and/or kV according to patient size and/or use of iterative reconstruction technique. CONTRAST:  100mL  OMNIPAQUE IOHEXOL 300 MG/ML  SOLN COMPARISON:  None Available. FINDINGS: Lower chest: Mild cardiomegaly, without pericardial or pleural effusion. Marked lower thoracic adenopathy, with a node in the azygoesophageal recess measuring 2.5 x 3.3 cm on 01/02. Hepatobiliary: Hepatomegaly at 27.1 cm craniocaudal. Irregular hepatic capsule which is most likely due multiple hepatic masses. The dominant segment 4 B mass measures 10.1 x 9.4 cm on 35/2 and causes upstream mild intrahepatic duct dilatation in segment 2 on image 28/2. Infiltrative right-sided masses including at 7.8 x 5.3 cm on 47/2. Normal gallbladder, without biliary ductal dilatation. Pancreas: Normal, without mass or ductal dilatation. Spleen: Normal in size, without focal abnormality. Adrenals/Urinary Tract: Left larger than right adrenal nodules. Example on the left at 3.3 cm and 8 HU. On the right at 1.8 cm and 15 HU. Upper pole right renal sinus cyst of 2.3 cm. Normal left kidney. No hydronephrosis. Normal urinary bladder. Stomach/Bowel: Normal stomach, without wall thickening. Eccentric right left rectal wall thickening on  image 84/2. more significant eccentric right rectosigmoid wall thickening including on 76/2. Example 2.6 x 3.0 cm. Extensive colonic diverticulosis. Colonic stool burden suggests constipation. Normal terminal ileum and appendix. Normal small bowel. Vascular/Lymphatic: Aortic atherosclerosis. Significant abdominal retroperitoneal adenopathy. Index gastrohepatic ligament node measures 2.2 cm on 21/2. No pelvic sidewall adenopathy. There is extensive adenopathy within the sigmoid mesocolon with a nodal mass measuring 3.8 x 4.2 cm on 70/2. Reproductive: Normal uterus and adnexa. Other: Trace perihepatic ascites. No evidence of omental or peritoneal disease. Musculoskeletal: No acute osseous abnormality. IMPRESSION: 1. Constellation of findings which are consistent with widespread metastatic disease. Massive lower thoracic, mild-to-moderate  abdominal, and moderate pelvic adenopathy. Infiltrative bilateral hepatic masses. 2. The primary is most likely in the rectum or rectosigmoid colon, with areas of eccentric right wall thickening as detailed above. 3. Possible constipation. No evidence of high-grade bowel obstruction. 4. Left larger than right adrenal lesions. The left-sided lesion is consistent with an adenoma. The right-sided lesion is technically indeterminate but favored to represent an adenoma. 5. Segment 2 intrahepatic biliary duct dilatation secondary to the dominant segment 4A mass. Consider correlation with bilirubin levels. 6. Trace perihepatic ascites. 7. Incidental findings, including: Cardiomegaly. Aortic Atherosclerosis (ICD10-I70.0). Electronically Signed   By: Rockey Kilts M.D.   On: 02/18/2024 12:50   DG Chest 2 View Result Date: 02/18/2024 CLINICAL DATA:  Shortness of breath and epigastric pain. EXAM: CHEST - 2 VIEW COMPARISON:  None Available. FINDINGS: The heart size and mediastinal contours are within normal limits. No acute infiltrate, pleural effusion or pneumothorax identified. The visualized skeletal structures are unremarkable. IMPRESSION: No active cardiopulmonary disease. Electronically Signed   By: Suzen Dials M.D.   On: 02/18/2024 10:31    Labs:  CBC: Recent Labs    02/18/24 1011 02/19/24 0916 02/20/24 1907  WBC 14.9* 12.3* 13.7*  HGB 12.4 11.4* 12.3  HCT 38.5 36.6 39.3  PLT 414* 431* 436*    COAGS: Recent Labs    02/18/24 2030  INR 1.1    BMP: Recent Labs    02/18/24 1011 02/19/24 0434 02/19/24 0916 02/20/24 1907  NA 136  --  136 135  K 3.3* 3.3* 3.5 3.6  CL 98  --  94* 94*  CO2 25  --  27 27  GLUCOSE 119*  --  113* 98  BUN 17  --  16 16  CALCIUM 9.2  --  9.0 9.6  CREATININE 0.46  --  0.67 0.52  GFRNONAA >60  --  >60 >60    LIVER FUNCTION TESTS: Recent Labs    02/18/24 1011 02/19/24 0916 02/20/24 1907  BILITOT 0.9 0.9 0.7  AST 57* 40 39  ALT 22 19 20   ALKPHOS  129* 108 127*  PROT 7.7 7.6 7.5  ALBUMIN 3.4* 3.2* 3.3*    TUMOR MARKERS: No results for input(s): AFPTM, CEA, CA199, CHROMGRNA in the last 8760 hours.  Assessment and Plan:  Stage IV Colorectal Cancer: Trenae C Torosyan is a 58 y.o. female with a recent diagnosis of metastatic colorectal cancer, known to IR from previous liver lesion biopsy. She presents to [facility] Interventional Radiology department for an image-guided port-a-catheter insertion with Dr. JINNY Hall. Procedure to be performed under moderate sedation.   Risks and benefits of image guided port-a-catheter placement was discussed with the patient including, but not limited to bleeding, infection, pneumothorax, or fibrin sheath development and need for additional procedures.  All of the patient's questions were answered, patient is agreeable to proceed. Consent signed  and in chart.   Thank you for allowing our service to participate in Twilla C Pavelko 's care.    Electronically Signed: Glennon CHRISTELLA Bal, PA-C   02/27/2024, 1:43 PM     I spent a total of10 Minutes in face to face in clinical consultation, greater than 50% of which was counseling/coordinating care for port-a-catheter insertion.

## 2024-02-27 NOTE — Discharge Instructions (Signed)
 Implanted [facility] Guide  An implanted port is a type of central line that is placed under the skin. Central lines are used to provide IV access when treatment or nutrition needs to be given through a person's veins. Implanted ports are used for long-term IV access. An implanted port may be placed because: You need IV medicine that would be irritating to the small veins in your hands or arms. You need long-term IV medicines, such as antibiotics. You need IV nutrition for a long period. You need frequent blood draws for lab tests. You need dialysis.   Implanted ports are usually placed in the chest area, but they can also be placed in the upper arm, the abdomen, or the leg. An implanted port has two main parts: Reservoir. The reservoir is round and will appear as a small, raised area under your skin. The reservoir is the part where a needle is inserted to give medicines or draw blood. Catheter. The catheter is a thin, flexible tube that extends from the reservoir. The catheter is placed into a large vein. Medicine that is inserted into the reservoir goes into the catheter and then into the vein.   How will I care for my incision  You may shower tomorrow Please remove dressing in 24hrs not other skin care is needed  How is my port accessed? Special steps must be taken to access the port: Before the port is accessed, a numbing cream can be placed on the skin. This helps numb the skin over the port site. Your health care provider uses a sterile technique to access the port. Your health care provider must put on a mask and sterile gloves. The skin over your port is cleaned carefully with an antiseptic and allowed to dry. The port is gently pinched between sterile gloves, and a needle is inserted into the port. Only "non-coring" port needles should be used to access the port. Once the port is accessed, a blood return should be checked. This helps ensure that the port is in the vein and is not  clogged. If your port needs to remain accessed for a constant infusion, a clear (transparent) bandage will be placed over the needle site. The bandage and needle will need to be changed every week, or as directed by your health care provider.   What is flushing? Flushing helps keep the port from getting clogged. Follow your health care provider's instructions on how and when to flush the port. Ports are usually flushed with saline solution or a medicine called heparin. The need for flushing will depend on how the port is used. If the port is used for intermittent medicines or blood draws, the port will need to be flushed: After medicines have been given. After blood has been drawn. As part of routine maintenance. If a constant infusion is running, the port may not need to be flushed.   How long will my port stay implanted? The port can stay in for as long as your health care provider thinks it is needed. When it is time for the port to come out, surgery will be done to remove it. The procedure is similar to the one performed when the port was put in. When should I seek immediate medical care? When you have an implanted port, you should seek immediate medical care if: You notice a bad smell coming from the incision site. You have swelling, redness, or drainage at the incision site. You have more swelling or pain at the  port site or the surrounding area. You have a fever that is not controlled with medicine.   This information is not intended to replace advice given to you by your health care provider. Make sure you discuss any questions you have with your health care provider. Document Released: 04/18/2005 Document Revised: 09/24/2015 Document Reviewed: 12/24/2012 Elsevier Interactive Patient Education  2017 [employer].

## 2024-02-27 NOTE — Telephone Encounter (Signed)
 Per mychart Discussion - with Dr. Melanee. Script being pended for the hycodan.

## 2024-02-27 NOTE — Procedures (Signed)
 Vascular and Interventional Radiology Procedure Note  Patient: Melissa Johns DOB: 03/21/1966 Medical Record Number: [id number] Note Date/Time: 02/27/24 2:15 PM   Performing Physician: Thom Hall, MD Assistant(s): None  Diagnosis: Colon cancer  Procedure: PORT PLACEMENT  Anesthesia: Conscious Sedation Complications: None Estimated Blood Loss: Minimal  Findings:  Successful right-sided port placement, with the tip of the catheter in the proximal right atrium.  Plan: Catheter ready for use.  See detailed procedure note with images in PACS. The patient tolerated the procedure well without incident or complication and was returned to Recovery in stable condition.    Thom Hall, MD [facility] Inter[facility] Radiology   Pager. [phone] Clinic. [phone]

## 2024-02-27 NOTE — Telephone Encounter (Signed)
 I do not think that the patient needs to be seen in [facility].  She has significant disease burden and intrathoracic adenopathy that is causing the cough.  Please send her a prescription for Hycodan syrup and see if it helps

## 2024-02-27 NOTE — Progress Notes (Signed)
 Pharmacist Chemotherapy Monitoring - Initial Assessment    Anticipated start date: 03/01/24   The following has been reviewed per standard work regarding the patient's treatment regimen: The patient's diagnosis, treatment plan and drug doses, and organ/hematologic function Lab orders and baseline tests specific to treatment regimen  The treatment plan start date, drug sequencing, and pre-medications Prior authorization status  Patient's documented medication list, including drug-drug interaction screen and prescriptions for anti-emetics and supportive care specific to the treatment regimen The drug concentrations, fluid compatibility, administration routes, and timing of the medications to be used The patient's access for treatment and lifetime cumulative dose history, if applicable  The patient's medication allergies and previous infusion related reactions, if applicable   Changes made to treatment plan:  5FU pump will inf over 70 hrs - will d/c on Monday 03/04/24  Follow up needed:  Pending authorization for treatment  Pt will start Bevacizumab w/ C2 (port placement 02/27/24) F/U [facility]   Wilma Dollar, Pharm.D., CPP 02/27/2024@1 :38 PM

## 2024-02-27 NOTE — Progress Notes (Signed)
 [facility] CSW Progress Note  Clinical Social Work introduced self to patient during Patient Education with Raoul Moats, Charity fundraiser.  Provided information regarding CSW role, including counseling, advanced care planning and support group.  Answered questions as needed.  Follow Up Plan:  CSW will follow-up with patient by phone     Macario CHRISTELLA Au, LCSW Clinical Social Worker [facility]

## 2024-02-29 ENCOUNTER — Other Ambulatory Visit: Payer: Self-pay

## 2024-03-01 ENCOUNTER — Inpatient Hospital Stay

## 2024-03-01 ENCOUNTER — Inpatient Hospital Stay (HOSPITAL_BASED_OUTPATIENT_CLINIC_OR_DEPARTMENT_OTHER): Admitting: Oncology

## 2024-03-01 ENCOUNTER — Encounter: Payer: Self-pay | Admitting: Oncology

## 2024-03-01 VITALS — BP 149/72 | HR 99 | Resp 17

## 2024-03-01 VITALS — BP 175/92 | HR 98 | Temp 96.9°F | Resp 19 | Ht 70.0 in | Wt 312.0 lb

## 2024-03-01 DIAGNOSIS — C19 Malignant neoplasm of rectosigmoid junction: Secondary | ICD-10-CM

## 2024-03-01 DIAGNOSIS — Z5111 Encounter for antineoplastic chemotherapy: Secondary | ICD-10-CM | POA: Diagnosis not present

## 2024-03-01 DIAGNOSIS — G893 Neoplasm related pain (acute) (chronic): Secondary | ICD-10-CM | POA: Diagnosis not present

## 2024-03-01 DIAGNOSIS — Z1379 Encounter for other screening for genetic and chromosomal anomalies: Secondary | ICD-10-CM

## 2024-03-01 LAB — CBC WITH DIFFERENTIAL (CANCER CENTER ONLY)
Abs Immature Granulocytes: 0.09 K/uL — ABNORMAL HIGH (ref 0.00–0.07)
Basophils Absolute: 0.1 K/uL (ref 0.0–0.1)
Basophils Relative: 0 %
Eosinophils Absolute: 0.1 K/uL (ref 0.0–0.5)
Eosinophils Relative: 1 %
HCT: 37.7 % (ref 36.0–46.0)
Hemoglobin: 11.9 g/dL — ABNORMAL LOW (ref 12.0–15.0)
Immature Granulocytes: 1 %
Lymphocytes Relative: 15 %
Lymphs Abs: 1.9 K/uL (ref 0.7–4.0)
MCH: 25 pg — ABNORMAL LOW (ref 26.0–34.0)
MCHC: 31.6 g/dL (ref 30.0–36.0)
MCV: 79.2 fL — ABNORMAL LOW (ref 80.0–100.0)
Monocytes Absolute: 1 K/uL (ref 0.1–1.0)
Monocytes Relative: 8 %
Neutro Abs: 9.4 K/uL — ABNORMAL HIGH (ref 1.7–7.7)
Neutrophils Relative %: 75 %
Platelet Count: 523 K/uL — ABNORMAL HIGH (ref 150–400)
RBC: 4.76 MIL/uL (ref 3.87–5.11)
RDW: 14.5 % (ref 11.5–15.5)
WBC Count: 12.4 K/uL — ABNORMAL HIGH (ref 4.0–10.5)
nRBC: 0 % (ref 0.0–0.2)

## 2024-03-01 LAB — CMP (CANCER CENTER ONLY)
ALT: 22 U/L (ref 0–44)
AST: 43 U/L — ABNORMAL HIGH (ref 15–41)
Albumin: 3.1 g/dL — ABNORMAL LOW (ref 3.5–5.0)
Alkaline Phosphatase: 126 U/L (ref 38–126)
Anion gap: 9 (ref 5–15)
BUN: 19 mg/dL (ref 6–20)
CO2: 28 mmol/L (ref 22–32)
Calcium: 9.1 mg/dL (ref 8.9–10.3)
Chloride: 96 mmol/L — ABNORMAL LOW (ref 98–111)
Creatinine: 0.52 mg/dL (ref 0.44–1.00)
GFR, Estimated: >60 mL/min (ref >60)
Glucose, Bld: 131 mg/dL — ABNORMAL HIGH (ref 70–99)
Potassium: 3.6 mmol/L (ref 3.5–5.1)
Sodium: 133 mmol/L — ABNORMAL LOW (ref 135–145)
Total Bilirubin: 0.7 mg/dL (ref 0.0–1.2)
Total Protein: 7.4 g/dL (ref 6.5–8.1)

## 2024-03-01 LAB — TOTAL PROTEIN, URINE DIPSTICK: Protein, ur: NEGATIVE mg/dL

## 2024-03-01 LAB — GENETIC SCREENING ORDER

## 2024-03-01 MED ORDER — PALONOSETRON HCL INJECTION 0.25 MG/5ML
0.2500 mg | Freq: Once | INTRAVENOUS | Status: AC
  Administered 2024-03-01: 0.25 mg via INTRAVENOUS
  Filled 2024-03-01: qty 5

## 2024-03-01 MED ORDER — SODIUM CHLORIDE 0.9 % IV SOLN
2400.0000 mg/m2 | INTRAVENOUS | Status: DC
  Administered 2024-03-01: 7000 mg via INTRAVENOUS
  Filled 2024-03-01: qty 140

## 2024-03-01 MED ORDER — DEXTROSE 5 % IV SOLN
INTRAVENOUS | Status: DC
  Filled 2024-03-01: qty 250

## 2024-03-01 MED ORDER — FLUOROURACIL CHEMO INJECTION 2.5 GM/50ML
400.0000 mg/m2 | Freq: Once | INTRAVENOUS | Status: AC
  Administered 2024-03-01: 1000 mg via INTRAVENOUS
  Filled 2024-03-01: qty 20

## 2024-03-01 MED ORDER — DEXAMETHASONE SOD PHOSPHATE PF 10 MG/ML IJ SOLN
10.0000 mg | Freq: Once | INTRAMUSCULAR | Status: AC
  Administered 2024-03-01: 10 mg via INTRAVENOUS

## 2024-03-01 MED ORDER — OXALIPLATIN CHEMO INJECTION 100 MG/20ML
85.0000 mg/m2 | Freq: Once | INTRAVENOUS | Status: AC
  Administered 2024-03-01: 225 mg via INTRAVENOUS
  Filled 2024-03-01: qty 40

## 2024-03-01 MED ORDER — LEUCOVORIN CALCIUM INJECTION 350 MG
400.0000 mg/m2 | Freq: Once | INTRAVENOUS | Status: AC
  Administered 2024-03-01: 1056 mg via INTRAVENOUS
  Filled 2024-03-01: qty 50

## 2024-03-01 NOTE — Progress Notes (Signed)
 1320: pt up to the BR and states My feet are swelling a little and are a little uncomfortable no other s/s at this time. Dr. Melanee made aware. Per Dr. Melanee pt to be seen in exam room after completion of treatment. Environmental temperature in infusion room is noted to be hot.  1334: Pt returns from the BR, VS stable. Pt agrees to seeing MD post treatment.  1350: Pt completed treatment.  1400: pt escorted to MD exam room (environmental temperature noted to be much cooler, which pt states feels much better). Hands noted to be swelling. Hand, arms, and feet are red in color. Pt continues to deny any s/s, no distress noted.  1405: Dr. Melanee in exam room. Redness in hands, arms and feet are improving. Per Dr. Melanee monitor for 15 minutes.  1435: Symptoms resolved. Per Dr. Melanee okay to discharge home pt to call clinic with any concerns or questions. Pt to seek emergency care in the event of an emergency. Pt verbalizes understanding. Pt stable at discharge.

## 2024-03-01 NOTE — Progress Notes (Signed)
 Patient has no new or acute concerns at this time and feeling ok.

## 2024-03-01 NOTE — Patient Instructions (Signed)
 [facility] - A DEPT OF [facility]  Discharge Instructions: Thank you for choosing [facility] to provide your oncology and hematology care.  If you have a lab appointment with the Cancer Center, please go directly to the Cancer Center and check in at the registration area.  Wear comfortable clothing and clothing appropriate for easy access to any Portacath or PICC line.   We strive to give you quality time with your provider. You may need to reschedule your appointment if you arrive late (15 or more minutes).  Arriving late affects you and other patients whose appointments are after yours.  Also, if you miss three or more appointments without notifying the office, you may be dismissed from the clinic at the provider's discretion.      For prescription refill requests, have your pharmacy contact our office and allow 72 hours for refills to be completed.    Today you received the following chemotherapy and/or immunotherapy agents Oxaliplatin , Leucovorin  and Adrucil        To help prevent nausea and vomiting after your treatment, we encourage you to take your nausea medication as directed.  BELOW ARE SYMPTOMS THAT SHOULD BE REPORTED IMMEDIATELY: *FEVER GREATER THAN 100.4 F (38 C) OR HIGHER *CHILLS OR SWEATING *NAUSEA AND VOMITING THAT IS NOT CONTROLLED WITH YOUR NAUSEA MEDICATION *UNUSUAL SHORTNESS OF BREATH *UNUSUAL BRUISING OR BLEEDING *URINARY PROBLEMS (pain or burning when urinating, or frequent urination) *BOWEL PROBLEMS (unusual diarrhea, constipation, pain near the anus) TENDERNESS IN MOUTH AND THROAT WITH OR WITHOUT PRESENCE OF ULCERS (sore throat, sores in mouth, or a toothache) UNUSUAL RASH, SWELLING OR PAIN  UNUSUAL VAGINAL DISCHARGE OR ITCHING   Items with * indicate a potential emergency and should be followed up as soon as possible or go to the Emergency Department if any problems should occur.  Please show the CHEMOTHERAPY  ALERT CARD or IMMUNOTHERAPY ALERT CARD at check-in to the Emergency Department and triage nurse.  Should you have questions after your visit or need to cancel or reschedule your appointment, please contact [facility] - A DEPT OF [name]  [phone] and follow the prompts.  Office hours are 8:00 a.m. to 4:30 p.m. Monday - Friday. Please note that voicemails left after 4:00 p.m. may not be returned until the following business day.  We are closed weekends and major holidays. You have access to a nurse at all times for urgent questions. Please call the main number to the clinic [phone] and follow the prompts.  For any non-urgent questions, you may also contact your provider using MyChart. We now offer e-Visits for anyone 77 and older to request care online for non-urgent symptoms. For details visit [email or web address].   Also download the MyChart app! Go to the app store, search MyChart, open the app, select [place], and log in with your MyChart username and password.   Oxaliplatin  Injection What is this medication? OXALIPLATIN  (ox AL i PLA tin) treats colorectal cancer. It works by slowing down the growth of cancer cells. This medicine may be used for other purposes; ask your health care provider or pharmacist if you have questions. COMMON BRAND NAME(S): Eloxatin  What should I tell my care team before I take this medication? They need to know if you have any of these conditions: Heart disease History of irregular heartbeat or rhythm Liver disease Low blood cell levels (white cells, red cells, and platelets) Lung or  breathing disease, such as asthma Take medications that treat or prevent blood clots Tingling of the fingers, toes, or other nerve disorder An unusual or allergic reaction to oxaliplatin , other medications, foods, dyes, or preservatives If you or your partner are pregnant or trying to get pregnant Breast-feeding How should I  use this medication? This medication is injected into a vein. It is given by your care team in a hospital or clinic setting. Talk to your care team about the use of this medication in children. Special care may be needed. Overdosage: If you think you have taken too much of this medicine contact a poison control center or emergency room at once. NOTE: This medicine is only for you. Do not share this medicine with others. What if I miss a dose? Keep appointments for follow-up doses. It is important not to miss a dose. Call your care team if you are unable to keep an appointment. What may interact with this medication? Do not take this medication with any of the following: Cisapride Dronedarone Pimozide Thioridazine This medication may also interact with the following: Aspirin and aspirin-like medications Certain medications that treat or prevent blood clots, such as warfarin, apixaban, dabigatran, and rivaroxaban Cisplatin Cyclosporine Diuretics Medications for infection, such as acyclovir, adefovir, amphotericin B, bacitracin, cidofovir, foscarnet, ganciclovir, gentamicin , pentamidine, vancomycin NSAIDs, medications for pain and inflammation, such as ibuprofen or naproxen Other medications that cause heart rhythm changes Pamidronate Zoledronic acid This list may not describe all possible interactions. Give your health care provider a list of all the medicines, herbs, non-prescription drugs, or dietary supplements you use. Also tell them if you smoke, drink alcohol, or use illegal drugs. Some items may interact with your medicine. What should I watch for while using this medication? Your condition will be monitored carefully while you are receiving this medication. You may need blood work while taking this medication. This medication may make you feel generally unwell. This is not uncommon as chemotherapy can affect healthy cells as well as cancer cells. Report any side effects. Continue  your course of treatment even though you feel ill unless your care team tells you to stop. This medication may increase your risk of getting an infection. Call your care team for advice if you get a fever, chills, sore throat, or other symptoms of a cold or flu. Do not treat yourself. Try to avoid being around people who are sick. Avoid taking medications that contain aspirin, acetaminophen, ibuprofen, naproxen, or ketoprofen unless instructed by your care team. These medications may hide a fever. Be careful brushing or flossing your teeth or using a toothpick because you may get an infection or bleed more easily. If you have any dental work done, tell your dentist you are receiving this medication. This medication can make you more sensitive to cold. Do not drink cold drinks or use ice. Cover exposed skin before coming in contact with cold temperatures or cold objects. When out in cold weather wear warm clothing and cover your mouth and nose to warm the air that goes into your lungs. Tell your care team if you get sensitive to the cold. Talk to your care team if you or your partner are pregnant or think either of you might be pregnant. This medication can cause serious birth defects if taken during pregnancy and for 9 months after the last dose. A negative pregnancy test is required before starting this medication. A reliable form of contraception is recommended while taking this medication and for 9  months after the last dose. Talk to your care team about effective forms of contraception. Do not father a child while taking this medication and for 6 months after the last dose. Use a condom while having sex during this time period. Do not breastfeed while taking this medication and for 3 months after the last dose. This medication may cause infertility. Talk to your care team if you are concerned about your fertility. What side effects may I notice from receiving this medication? Side effects that you  should report to your care team as soon as possible: Allergic reactions--skin rash, itching, hives, swelling of the face, lips, tongue, or throat Bleeding--bloody or black, tar-like stools, vomiting blood or brown material that looks like coffee grounds, red or dark brown urine, small red or purple spots on skin, unusual bruising or bleeding Dry cough, shortness of breath or trouble breathing Heart rhythm changes--fast or irregular heartbeat, dizziness, feeling faint or lightheaded, chest pain, trouble breathing Infection--fever, chills, cough, sore throat, wounds that don't heal, pain or trouble when passing urine, general feeling of discomfort or being unwell Liver injury--right upper belly pain, loss of appetite, nausea, light-colored stool, dark yellow or brown urine, yellowing skin or eyes, unusual weakness or fatigue Low red blood cell level--unusual weakness or fatigue, dizziness, headache, trouble breathing Muscle injury--unusual weakness or fatigue, muscle pain, dark yellow or brown urine, decrease in amount of urine Pain, tingling, or numbness in the hands or feet Sudden and severe headache, confusion, change in vision, seizures, which may be signs of posterior reversible encephalopathy syndrome (PRES) Unusual bruising or bleeding Side effects that usually do not require medical attention (report to your care team if they continue or are bothersome): Diarrhea Nausea Pain, redness, or swelling with sores inside the mouth or throat Unusual weakness or fatigue Vomiting This list may not describe all possible side effects. Call your doctor for medical advice about side effects. You may report side effects to FDA at 1-800-FDA-1088. Where should I keep my medication? This medication is given in a hospital or clinic. It will not be stored at home. NOTE: This sheet is a summary. It may not cover all possible information. If you have questions about this medicine, talk to your doctor,  pharmacist, or health care provider.  2024 Elsevier/Gold Standard (2023-03-31 00:00:00)    Leucovorin  Injection What is this medication? LEUCOVORIN  (loo koe VOR in) prevents side effects from certain medications, such as methotrexate. It works by increasing folate levels. This helps protect healthy cells in your body. It may also be used to treat anemia caused by low levels of folate. It can also be used with fluorouracil , a type of chemotherapy, to treat colorectal cancer. It works by increasing the effects of fluorouracil  in the body. This medicine may be used for other purposes; ask your health care provider or pharmacist if you have questions. What should I tell my care team before I take this medication? They need to know if you have any of these conditions: Anemia from low levels of vitamin B12 in the blood An unusual or allergic reaction to leucovorin , folic acid , other medications, foods, dyes, or preservatives Pregnant or trying to get pregnant Breastfeeding How should I use this medication? This medication is injected into a vein or a muscle. It is given by your care team in a hospital or clinic setting. Talk to your care team about the use of this medication in children. Special care may be needed. Overdosage: If you think you have  taken too much of this medicine contact a poison control center or emergency room at once. NOTE: This medicine is only for you. Do not share this medicine with others. What if I miss a dose? Keep appointments for follow-up doses. It is important not to miss your dose. Call your care team if you are unable to keep an appointment. What may interact with this medication? Capecitabine Fluorouracil  Phenobarbital Phenytoin Primidone Trimethoprim;sulfamethoxazole This list may not describe all possible interactions. Give your health care provider a list of all the medicines, herbs, non-prescription drugs, or dietary supplements you use. Also tell them if  you smoke, drink alcohol, or use illegal drugs. Some items may interact with your medicine. What should I watch for while using this medication? Your condition will be monitored carefully while you are receiving this medication. This medication may increase the side effects of 5-fluorouracil . Tell your care team if you have diarrhea or mouth sores that do not get better or that get worse. What side effects may I notice from receiving this medication? Side effects that you should report to your care team as soon as possible: Allergic reactions--skin rash, itching, hives, swelling of the face, lips, tongue, or throat This list may not describe all possible side effects. Call your doctor for medical advice about side effects. You may report side effects to FDA at 1-800-FDA-1088. Where should I keep my medication? This medication is given in a hospital or clinic. It will not be stored at home. NOTE: This sheet is a summary. It may not cover all possible information. If you have questions about this medicine, talk to your doctor, pharmacist, or health care provider.  2024 Elsevier/Gold Standard (2021-09-21 00:00:00)   Fluorouracil  Injection What is this medication? FLUOROURACIL  (flure oh YOOR a sil) treats some types of cancer. It works by slowing down the growth of cancer cells. This medicine may be used for other purposes; ask your health care provider or pharmacist if you have questions. COMMON BRAND NAME(S): Adrucil  What should I tell my care team before I take this medication? They need to know if you have any of these conditions: Blood disorders Dihydropyrimidine dehydrogenase (DPD) deficiency Infection, such as chickenpox, cold sores, herpes Kidney disease Liver disease Poor nutrition Recent or ongoing radiation therapy An unusual or allergic reaction to fluorouracil , other medications, foods, dyes, or preservatives If you or your partner are pregnant or trying to get  pregnant Breast-feeding How should I use this medication? This medication is injected into a vein. It is administered by your care team in a hospital or clinic setting. Talk to your care team about the use of this medication in children. Special care may be needed. Overdosage: If you think you have taken too much of this medicine contact a poison control center or emergency room at once. NOTE: This medicine is only for you. Do not share this medicine with others. What if I miss a dose? Keep appointments for follow-up doses. It is important not to miss your dose. Call your care team if you are unable to keep an appointment. What may interact with this medication? Do not take this medication with any of the following: Live virus vaccines This medication may also interact with the following: Medications that treat or prevent blood clots, such as warfarin, enoxaparin, dalteparin This list may not describe all possible interactions. Give your health care provider a list of all the medicines, herbs, non-prescription drugs, or dietary supplements you use. Also tell them if you smoke,  drink alcohol, or use illegal drugs. Some items may interact with your medicine. What should I watch for while using this medication? Your condition will be monitored carefully while you are receiving this medication. This medication may make you feel generally unwell. This is not uncommon as chemotherapy can affect healthy cells as well as cancer cells. Report any side effects. Continue your course of treatment even though you feel ill unless your care team tells you to stop. In some cases, you may be given additional medications to help with side effects. Follow all directions for their use. This medication may increase your risk of getting an infection. Call your care team for advice if you get a fever, chills, sore throat, or other symptoms of a cold or flu. Do not treat yourself. Try to avoid being around people who are  sick. This medication may increase your risk to bruise or bleed. Call your care team if you notice any unusual bleeding. Be careful brushing or flossing your teeth or using a toothpick because you may get an infection or bleed more easily. If you have any dental work done, tell your dentist you are receiving this medication. Avoid taking medications that contain aspirin, acetaminophen, ibuprofen, naproxen, or ketoprofen unless instructed by your care team. These medications may hide a fever. Do not treat diarrhea with over the counter products. Contact your care team if you have diarrhea that lasts more than 2 days or if it is severe and watery. This medication can make you more sensitive to the sun. Keep out of the sun. If you cannot avoid being in the sun, wear protective clothing and sunscreen. Do not use sun lamps, tanning beds, or tanning booths. Talk to your care team if you or your partner wish to become pregnant or think you might be pregnant. This medication can cause serious birth defects if taken during pregnancy and for 3 months after the last dose. A reliable form of contraception is recommended while taking this medication and for 3 months after the last dose. Talk to your care team about effective forms of contraception. Do not father a child while taking this medication and for 3 months after the last dose. Use a condom while having sex during this time period. Do not breastfeed while taking this medication. This medication may cause infertility. Talk to your care team if you are concerned about your fertility. What side effects may I notice from receiving this medication? Side effects that you should report to your care team as soon as possible: Allergic reactions--skin rash, itching, hives, swelling of the face, lips, tongue, or throat Heart attack--pain or tightness in the chest, shoulders, arms, or jaw, nausea, shortness of breath, cold or clammy skin, feeling faint or  lightheaded Heart failure--shortness of breath, swelling of the ankles, feet, or hands, sudden weight gain, unusual weakness or fatigue Heart rhythm changes--fast or irregular heartbeat, dizziness, feeling faint or lightheaded, chest pain, trouble breathing High ammonia level--unusual weakness or fatigue, confusion, loss of appetite, nausea, vomiting, seizures Infection--fever, chills, cough, sore throat, wounds that don't heal, pain or trouble when passing urine, general feeling of discomfort or being unwell Low red blood cell level--unusual weakness or fatigue, dizziness, headache, trouble breathing Pain, tingling, or numbness in the hands or feet, muscle weakness, change in vision, confusion or trouble speaking, loss of balance or coordination, trouble walking, seizures Redness, swelling, and blistering of the skin over hands and feet Severe or prolonged diarrhea Unusual bruising or bleeding Side effects that  usually do not require medical attention (report to your care team if they continue or are bothersome): Dry skin Headache Increased tears Nausea Pain, redness, or swelling with sores inside the mouth or throat Sensitivity to light Vomiting This list may not describe all possible side effects. Call your doctor for medical advice about side effects. You may report side effects to FDA at 1-800-FDA-1088. Where should I keep my medication? This medication is given in a hospital or clinic. It will not be stored at home. NOTE: This sheet is a summary. It may not cover all possible information. If you have questions about this medicine, talk to your doctor, pharmacist, or health care provider.  2024 Elsevier/Gold Standard (2021-08-24 00:00:00)

## 2024-03-01 NOTE — Progress Notes (Signed)
 Per Dr rao  keep 5-FU pump at 7000 mg for initial dose

## 2024-03-01 NOTE — Progress Notes (Signed)
 Hematology/Oncology Consult note [facility]  Telephone:([phone] [phone]  Patient Care Team: Patient, No Pcp Per as PCP - General (General Practice) Maurie Rayfield BIRCH, RN as Oncology Nurse Navigator Melanee Annah BROCKS, MD as Consulting Physician (Oncology)   Name of the patient: Melissa Johns  [id number]  Aug 21, 1965   Date of visit: 03/01/24  Diagnosis-  Cancer Staging  Colorectal cancer, stage IV [facility]) Staging form: Colon and Rectum, AJCC 8th Edition - Clinical stage from 02/23/2024: Stage IVC (cTX, cN2b, pM1c) - Signed by Melanee Annah BROCKS, MD on 02/25/2024    Chief complaint/ Reason for visit-on treatment assessment prior to cycle 1 of palliative FOLFOX chemotherapy  Heme/Onc history: patient is a 58 year old female with a past medical history significant for hypertension who presented with symptoms of epigastric and right upper quadrant abdominal pain which has been ongoing for 2 weeks.She underwent CT abdomen pelvis with contrast in the ER which showed significant thoracic adenopathy, multiple hepatic masses.  Left greater than right adrenal nodules.  There was evidence of intra-abdominal adenopathy including index gastrohepatic lymph node measuring 2.2 cm.  Extensive adenopathy within the sigmoid mesocolon with a nodal mass of 3.8 x 4.2 cm.  Findings concerning for rectosigmoid primary given the eccentric right wall thickening.   CT angio chest did not show any evidence of large central pulmonary embolus or subsegmental pulmonary embolism.  Bulky mediastinal and bilateral hilar adenopathy   I Interval history- Discussed the use of AI scribe software for clinical note transcription with the patient, who gave verbal consent to proceed.  History of Present Illness   Melissa Johns is a 58 year old female with metastatic colon cancer who presents for chemotherapy treatment.  She is undergoing treatment for metastatic colon cancer. She has a port  that was placed on Tuesday, which remains sensitive.  She has been experiencing pain management issues following her recent hospital discharge. Initially prescribed Dilaudid 1 mg every five hours, she faced challenges with pharmacy fulfillment due to prescription timing. She is currently taking Dilaudid 1 mg twice daily, once in the morning and once before bed, and has started using fentanyl patches, which she began on the previous Friday. She has a supply of fentanyl patches to last for two weeks.  She experiences bowel movement changes due to pain medication, initially taking Miralax once daily but plans to increase to twice daily. She typically has a bowel movement every morning, which is her normal pattern.  She reports a cough that has improved, though she experiences discomfort with deep breaths. The cough is less frequent and the pressure on her lungs has decreased.       ECOG PS- 1 Pain scale- 3 Opioid associated constipation- no  Review of systems- Review of Systems  Constitutional:  Positive for malaise/fatigue. Negative for chills, fever and weight loss.  HENT:  Negative for congestion, ear discharge and nosebleeds.   Eyes:  Negative for blurred vision.  Respiratory:  Positive for cough. Negative for hemoptysis, sputum production, shortness of breath and wheezing.   Cardiovascular:  Negative for chest pain, palpitations, orthopnea and claudication.  Gastrointestinal:  Positive for abdominal pain. Negative for blood in stool, constipation, diarrhea, heartburn, melena, nausea and vomiting.  Genitourinary:  Negative for dysuria, flank pain, frequency, hematuria and urgency.  Musculoskeletal:  Negative for back pain, joint pain and myalgias.  Skin:  Negative for rash.  Neurological:  Negative for dizziness, tingling, focal weakness, seizures, weakness and headaches.  Endo/Heme/Allergies:  Does not bruise/bleed easily.  Psychiatric/Behavioral:  Negative for depression and suicidal  ideas. The patient does not have insomnia.       Allergies  Allergen Reactions   Penicillins Other (See Comments)    joints lock up  Joints lock becomes immobile, joint pain     Past Medical History:  Diagnosis Date   Ectopic pregnancy    Hypertension    Miscarriage      Past Surgical History:  Procedure Laterality Date   CYST REMOVAL HAND Right    IR IMAGING GUIDED PORT INSERTION  02/27/2024   LIVER BIOPSY     SALPINGECTOMY     Etopic Pregnancy    Social History   Socioeconomic History   Marital status: Married    Spouse name: Ozell   Number of children: 3   Years of education: Not on file   Highest education level: Not on file  Occupational History   Not on file  Tobacco Use   Smoking status: Former    Types: Cigarettes   Smokeless tobacco: Never  Vaping Use   Vaping status: Former   Quit date: 02/13/2024  Substance and Sexual Activity   Alcohol use: No   Drug use: No   Sexual activity: Yes  Other Topics Concern   Not on file  Social History Narrative   Not on file   Social Drivers of Health   Financial Resource Strain: Not on file  Food Insecurity: No Food Insecurity (02/23/2024)   Hunger Vital Sign    Worried About Running Out of Food in the Last Year: Never true    Ran Out of Food in the Last Year: Never true  Transportation Needs: No Transportation Needs (02/23/2024)   PRAPARE - Administrator, Civil Service (Medical): No    Lack of Transportation (Non-Medical): No  Physical Activity: Not on file  Stress: Not on file  Social Connections: Not on file  Intimate Partner Violence: Not At Risk (02/23/2024)   Humiliation, Afraid, Rape, and Kick questionnaire    Fear of Current or Ex-Partner: No    Emotionally Abused: No    Physically Abused: No    Sexually Abused: No    Family History  Problem Relation Age of Onset   Aneurysm Mother    Hypertension Mother    Heart Problems Father    Breast cancer Sister 35   COPD  Half-Sister      Current Outpatient Medications:    fentaNYL (DURAGESIC) 12 MCG/HR, Place 1 patch onto the skin every 3 (three) days., Disp: 10 patch, Rfl: 0   HYDROcodone bit-homatropine (HYCODAN) 5-1.5 MG/5ML syrup, Take 5 mLs by mouth every 6 (six) hours as needed for cough., Disp: 120 mL, Rfl: 0   HYDROmorphone (DILAUDID) 2 MG tablet, Take 1 tablet (2 mg total) by mouth every 3 (three) hours., Disp: 90 tablet, Rfl: 0   lisinopril-hydrochlorothiazide (ZESTORETIC) 20-12.5 MG tablet, Take 1 tablet by mouth daily., Disp: 30 tablet, Rfl: 2   polyethylene glycol (MIRALAX) 17 g packet, Take 17 g by mouth 2 (two) times daily., Disp: 180 packet, Rfl: 0   dexamethasone (DECADRON) 4 MG tablet, Take 2 tablets (8 mg total) by mouth daily. Start the day after chemotherapy for 2 days. Take with food., Disp: 30 tablet, Rfl: 1   lidocaine-prilocaine (EMLA) cream, Apply to affected area once, Disp: 30 g, Rfl: 3   ondansetron (ZOFRAN) 8 MG tablet, Take 1 tablet (8 mg total) by mouth every 8 (eight) hours as  needed for nausea or vomiting. Start on the third day after chemotherapy., Disp: 30 tablet, Rfl: 1   prochlorperazine (COMPAZINE) 10 MG tablet, Take 1 tablet (10 mg total) by mouth every 6 (six) hours as needed for nausea or vomiting., Disp: 30 tablet, Rfl: 1 No current facility-administered medications for this visit.  Facility-Administered Medications Ordered in Other Visits:    dextrose 5 % solution, , Intravenous, Continuous, Melanee Annah BROCKS, MD, Last Rate: 10 mL/hr at 03/01/24 1017, New Bag at 03/01/24 1017   fluorouracil (ADRUCIL) 7,000 mg in sodium chloride 0.9 % 110 mL chemo infusion, 2,400 mg/m2 (Treatment Plan Recorded), Intravenous, 1 day or 1 dose, Melanee Annah BROCKS, MD   fluorouracil (ADRUCIL) chemo injection 1,000 mg, 400 mg/m2 (Treatment Plan Recorded), Intravenous, Once, Melanee Annah BROCKS, MD   leucovorin 1,056 mg in dextrose 5 % 250 mL infusion, 400 mg/m2 (Treatment Plan Recorded), Intravenous,  Once, Melanee Annah BROCKS, MD, Last Rate: 151 mL/hr at 03/01/24 1111, 1,056 mg at 03/01/24 1111   oxaliplatin (ELOXATIN) 225 mg in dextrose 5 % 500 mL chemo infusion, 85 mg/m2 (Treatment Plan Recorded), Intravenous, Once, Melanee Annah BROCKS, MD, Last Rate: 273 mL/hr at 03/01/24 1114, 225 mg at 03/01/24 1114  Physical exam:  Vitals:   03/01/24 0928 03/01/24 0938  BP: (!) 142/84 (!) 175/92  Pulse: 95 98  Resp: 19   Temp: (!) 96.9 F (36.1 C)   TempSrc: Tympanic   SpO2: 96%   Weight: (!) 312 lb (141.5 kg)   Height: 5' 10 (1.778 m)    Physical Exam Cardiovascular:     Rate and Rhythm: Normal rate and regular rhythm.     Heart sounds: Normal heart sounds.  Pulmonary:     Effort: Pulmonary effort is normal.     Breath sounds: Normal breath sounds.  Skin:    General: Skin is warm and dry.  Neurological:     Mental Status: She is alert and oriented to person, place, and time.      I have personally reviewed labs listed below:    Latest Ref Rng & Units 03/01/2024    9:06 AM  CMP  Glucose 70 - 99 mg/dL 868   BUN 6 - 20 mg/dL 19   Creatinine 9.55 - 1.00 mg/dL 9.47   Sodium 864 - 854 mmol/L 133   Potassium 3.5 - 5.1 mmol/L 3.6   Chloride 98 - 111 mmol/L 96   CO2 22 - 32 mmol/L 28   Calcium 8.9 - 10.3 mg/dL 9.1   Total Protein 6.5 - 8.1 g/dL 7.4   Total Bilirubin 0.0 - 1.2 mg/dL 0.7   Alkaline Phos 38 - 126 U/L 126   AST 15 - 41 U/L 43   ALT 0 - 44 U/L 22       Latest Ref Rng & Units 03/01/2024    9:06 AM  CBC  WBC 4.0 - 10.5 K/uL 12.4   Hemoglobin 12.0 - 15.0 g/dL 88.0   Hematocrit 63.9 - 46.0 % 37.7   Platelets 150 - 400 K/uL 523    I have personally reviewed Radiology images listed below: No images are attached to the encounter.  IR IMAGING GUIDED PORT INSERTION Result Date: 02/27/2024 INDICATION: chemotherapy administration.  History of colon cancer. EXAM: IMPLANTED PORT A CATH PLACEMENT WITH ULTRASOUND AND FLUOROSCOPIC GUIDANCE MEDICATIONS: None ANESTHESIA/SEDATION:  Moderate (conscious) sedation was employed during this procedure. A total of Versed 2 mg and Fentanyl 100 mcg was administered intravenously. Moderate Sedation Time: 22 minutes.  The patient's level of consciousness and vital signs were monitored continuously by radiology nursing throughout the procedure under my direct supervision. FLUOROSCOPY: Radiation Exposure Index and estimated peak skin dose (PSD); Reference air kerma (RAK), 1 mGy. COMPLICATIONS: None immediate. PROCEDURE: The procedure, risks, benefits, and alternatives were explained to the patient. Questions regarding the procedure were encouraged and answered. The patient understands and consents to the procedure. The RIGHT neck and chest were prepped with chlorhexidine in a sterile fashion, and a sterile drape was applied covering the operative field. Maximum barrier sterile technique with sterile gowns and gloves were used for the procedure. A timeout was performed prior to the initiation of the procedure. Local anesthesia was provided with 1% lidocaine with epinephrine. After creating a small venotomy incision, a micropuncture kit was utilized to access the internal jugular vein under direct, real-time ultrasound guidance. Ultrasound image documentation was performed. The microwire was kinked to measure appropriate catheter length. A subcutaneous port pocket was then created along the upper chest wall utilizing a combination of sharp and blunt dissection. The pocket was irrigated with sterile saline. A single lumen power injectable port was chosen for placement. The 8 Fr catheter was tunneled from the port pocket site to the venotomy incision. The port was placed in the pocket. The external catheter was trimmed to appropriate length. At the venotomy, an 8 Fr peel-away sheath was placed over a guidewire under fluoroscopic guidance. The catheter was then placed through the sheath and the sheath was removed. Final catheter positioning was confirmed and  documented with a fluoroscopic spot radiograph. The port was accessed with a Huber needle, aspirated and flushed with heparinized saline. The port pocket incision was closed with interrupted 3-0 Vicryl suture then Dermabond was applied, including at the venotomy incision. Dressings were placed. The patient tolerated the procedure well without immediate post procedural complication. IMPRESSION: Successful placement of a RIGHT internal jugular approach power injectable Port-A-Cath. The tip of the catheter is positioned at the superior cavo-atrial junction. The catheter is ready for immediate use. Thom Hall, MD [facility] Radiology Electronically Signed   By: Thom Hall M.D.   On: 02/27/2024 15:05   CT ABDOMEN PELVIS W CONTRAST Result Date: 02/21/2024 CLINICAL DATA:  History of liver biopsy yesterday with persistent upper abdominal pain, initial encounter EXAM: CT ABDOMEN AND PELVIS WITH CONTRAST TECHNIQUE: Multidetector CT imaging of the abdomen and pelvis was performed using the standard protocol following bolus administration of intravenous contrast. RADIATION DOSE REDUCTION: This exam was performed according to the departmental dose-optimization program which includes automated exposure control, adjustment of the mA and/or kV according to patient size and/or use of iterative reconstruction technique. CONTRAST:  OMNIPAQUE IOHEXOL 350 MG/ML SOLN COMPARISON:  02/18/2024 FINDINGS: Lower chest: Lung bases are free of acute infiltrate. Mediastinal and hilar adenopathy is again similar to that seen on recent exam. Hepatobiliary: Liver is well visualized and demonstrates multiple scattered hypodensities throughout the liver. These appear worst in the left lobe with changes of very mild ductal dilatation in the left lobe. This is similar to that seen on recent exam. The gallbladder is partially distended. Pancreas: Unremarkable. No pancreatic ductal dilatation  or surrounding inflammatory changes. Spleen: Normal in size without focal abnormality. Adrenals/Urinary Tract: Adrenal nodules are again identified bilaterally unchanged from the exam 3 days previous. Kidneys demonstrate a normal enhancement pattern. Peripelvic cyst is again identified in the upper pole of the right kidney stable from the prior exam. No renal calculi or  obstructive changes are seen. The bladder is within normal limits. Stomach/Bowel: No obstructive or inflammatory changes of the colon are noted. There is persistent wall thickening identified in the rectum but less well visualized. Some eccentric wall thickening is noted in the rectosigmoid stable from the prior exam. The appendix is within normal limits. Small bowel and stomach appear within normal limits. Vascular/Lymphatic: No aneurysmal dilatation is noted. Mild atherosclerotic calcifications are seen. Extensive lymphadenopathy is identified in the periaortic region of the upper abdomen as well as in the intra-aortocaval region. Soft tissue masses are noted adjacent to the sigmoid colon best seen on image number 73 of series 2 stable from the prior exam. Reproductive: Uterus appears within normal limits. Other: No free fluid is noted.  No herniation is seen. Musculoskeletal: No acute bony abnormality is noted. IMPRESSION: Extensive changes consistent with the known history of metastatic disease. These changes are stable in the interval from 2 days previous. This is likely of colonic origin given the changes in the rectosigmoid and rectum. Biopsy results from the liver are pending. Mild left biliary ductal dilatation related to the underlying hepatic metastatic disease. This is stable from the prior exam. No significant ascites. No findings to suggest post biopsy hemorrhage or other complication. Electronically Signed   By: Oneil Devonshire M.D.   On: 02/21/2024 00:31   US  BIOPSY (LIVER) Result Date: 02/19/2024 INDICATION: Masses of liver and  lymphadenopathy and pulmonary nodules in the chest. The patient presents for liver biopsy. EXAM: ULTRASOUND GUIDED CORE BIOPSY OF LIVER MEDICATIONS: None. ANESTHESIA/SEDATION: Moderate (conscious) sedation was employed during this procedure. A total of Versed 1.0 mg and Fentanyl 50 mcg was administered intravenously. Moderate Sedation Time: 13 minutes. The patient's level of consciousness and vital signs were monitored continuously by radiology nursing throughout the procedure under my direct supervision. PROCEDURE: The procedure, risks, benefits, and alternatives were explained to the patient. Questions regarding the procedure were encouraged and answered. The patient understands and consents to the procedure. A time out was performed prior to initiating the procedure. The abdominal wall was prepped with chlorhexidine in a sterile fashion, and a sterile drape was applied covering the operative field. A sterile gown and sterile gloves were used for the procedure. Local anesthesia was provided with 1% Lidocaine. Ultrasound was performed of the liver to localize lesions. A site was chosen over the anterior abdominal wall for biopsy. Under ultrasound guidance, a 17 gauge trocar needle was advanced into the left lobe of the liver. After confirming needle tip position, 3 separate coaxial 18 gauge core biopsy samples were obtained of a left lobe liver lesion. Samples were submitted in formalin. A slurry of Gel-Foam EmboCubes mixed with sterile saline was then injected as the outer needle was retracted and removed. Additional ultrasound was performed COMPLICATIONS: None immediate. FINDINGS: Large ill-defined hyperechoic mass in the left lobe of the liver measuring roughly 12 cm and smaller right lobe mass were localized by ultrasound. The left lobe lesion was chosen for biopsy. Solid tissue was obtained. IMPRESSION: Ultrasound-guided core biopsy performed of a large mass in the left lobe of the liver measuring up to  roughly 12 cm. Electronically Signed   By: Marcey Moan M.D.   On: 02/19/2024 13:09   CT Angio Chest PE W and/or Wo Contrast Result Date: 02/18/2024 CLINICAL DATA:  Shortness of breath. Metastatic disease identified on abdomen and pelvis CT earlier today. * Tracking Code: BO * EXAM: CT ANGIOGRAPHY CHEST WITH CONTRAST TECHNIQUE: Multidetector CT imaging of the  chest was performed using the standard protocol during bolus administration of intravenous contrast. Multiplanar CT image reconstructions and MIPs were obtained to evaluate the vascular anatomy. RADIATION DOSE REDUCTION: This exam was performed according to the departmental dose-optimization program which includes automated exposure control, adjustment of the mA and/or kV according to patient size and/or use of iterative reconstruction technique. CONTRAST:  75mL OMNIPAQUE IOHEXOL 350 MG/ML SOLN COMPARISON:  None Available. FINDINGS: Cardiovascular: Heart size mildly enlarged. No substantial pericardial effusion. Mild atherosclerotic calcification is noted in the wall of the thoracic aorta. Enlargement of the pulmonary outflow tract/main pulmonary arteries suggests pulmonary arterial hypertension. No large central pulmonary embolus in the main or lobar pulmonary arteries. No definite segmental or subsegmental pulmonary embolus evident although assessment is limited by bolus timing and evaluation of these arteries may be unreliable. Mediastinum/Nodes: Bulky mediastinal lymphadenopathy including a 2.7 cm short axis right paratracheal lymph node and 2.5 cm short axis prevascular lymph node. There is bilateral hilar lymphadenopathy. The esophagus has normal imaging features. There is no axillary lymphadenopathy. Lungs/Pleura: Rr 18 mm parahilar right lung nodule identified on 65/6. 7 mm right lower lobe pulmonary nodule identified on 72/6. Upper Abdomen: See report for abdomen/pelvis CT performed immediately prior to this study and dictated separately.  Musculoskeletal: No worrisome lytic or sclerotic osseous abnormality. Review of the MIP images confirms the above findings. IMPRESSION: 1. No large central pulmonary embolus in the main or lobar pulmonary arteries. No definite segmental or subsegmental pulmonary embolus evident although assessment is limited by bolus timing and evaluation of these arteries may be unreliable. 2. Bulky mediastinal and bilateral hilar lymphadenopathy. Imaging features are compatible with metastatic disease. 3. 18 mm parahilar right lung nodule with 7 mm right lower lobe pulmonary nodule. Imaging features are compatible with metastatic disease. 4. Enlargement of the pulmonary outflow tract/main pulmonary arteries suggests pulmonary arterial hypertension. 5.  Aortic Atherosclerosis (ICD10-I70.0). Electronically Signed   By: Camellia Candle M.D.   On: 02/18/2024 13:50   CT ABDOMEN PELVIS W CONTRAST Result Date: 02/18/2024 CLINICAL DATA:  Shortness of breath. Diarrhea. Right flank pain. * Tracking Code: BO * EXAM: CT ABDOMEN AND PELVIS WITH CONTRAST TECHNIQUE: Multidetector CT imaging of the abdomen and pelvis was performed using the standard protocol following bolus administration of intravenous contrast. RADIATION DOSE REDUCTION: This exam was performed according to the departmental dose-optimization program which includes automated exposure control, adjustment of the mA and/or kV according to patient size and/or use of iterative reconstruction technique. CONTRAST:  100mL OMNIPAQUE IOHEXOL 300 MG/ML  SOLN COMPARISON:  None Available. FINDINGS: Lower chest: Mild cardiomegaly, without pericardial or pleural effusion. Marked lower thoracic adenopathy, with a node in the azygoesophageal recess measuring 2.5 x 3.3 cm on 01/02. Hepatobiliary: Hepatomegaly at 27.1 cm craniocaudal. Irregular hepatic capsule which is most likely due multiple hepatic masses. The dominant segment 4 B mass measures 10.1 x 9.4 cm on 35/2 and causes upstream mild  intrahepatic duct dilatation in segment 2 on image 28/2. Infiltrative right-sided masses including at 7.8 x 5.3 cm on 47/2. Normal gallbladder, without biliary ductal dilatation. Pancreas: Normal, without mass or ductal dilatation. Spleen: Normal in size, without focal abnormality. Adrenals/Urinary Tract: Left larger than right adrenal nodules. Example on the left at 3.3 cm and 8 HU. On the right at 1.8 cm and 15 HU. Upper pole right renal sinus cyst of 2.3 cm. Normal left kidney. No hydronephrosis. Normal urinary bladder. Stomach/Bowel: Normal stomach, without wall thickening. Eccentric right left rectal wall thickening on image  84/2. more significant eccentric right rectosigmoid wall thickening including on 76/2. Example 2.6 x 3.0 cm. Extensive colonic diverticulosis. Colonic stool burden suggests constipation. Normal terminal ileum and appendix. Normal small bowel. Vascular/Lymphatic: Aortic atherosclerosis. Significant abdominal retroperitoneal adenopathy. Index gastrohepatic ligament node measures 2.2 cm on 21/2. No pelvic sidewall adenopathy. There is extensive adenopathy within the sigmoid mesocolon with a nodal mass measuring 3.8 x 4.2 cm on 70/2. Reproductive: Normal uterus and adnexa. Other: Trace perihepatic ascites. No evidence of omental or peritoneal disease. Musculoskeletal: No acute osseous abnormality. IMPRESSION: 1. Constellation of findings which are consistent with widespread metastatic disease. Massive lower thoracic, mild-to-moderate abdominal, and moderate pelvic adenopathy. Infiltrative bilateral hepatic masses. 2. The primary is most likely in the rectum or rectosigmoid colon, with areas of eccentric right wall thickening as detailed above. 3. Possible constipation. No evidence of high-grade bowel obstruction. 4. Left larger than right adrenal lesions. The left-sided lesion is consistent with an adenoma. The right-sided lesion is technically indeterminate but favored to represent an adenoma.  5. Segment 2 intrahepatic biliary duct dilatation secondary to the dominant segment 4A mass. Consider correlation with bilirubin levels. 6. Trace perihepatic ascites. 7. Incidental findings, including: Cardiomegaly. Aortic Atherosclerosis (ICD10-I70.0). Electronically Signed   By: Rockey Kilts M.D.   On: 02/18/2024 12:50   DG Chest 2 View Result Date: 02/18/2024 CLINICAL DATA:  Shortness of breath and epigastric pain. EXAM: CHEST - 2 VIEW COMPARISON:  None Available. FINDINGS: The heart size and mediastinal contours are within normal limits. No acute infiltrate, pleural effusion or pneumothorax identified. The visualized skeletal structures are unremarkable. IMPRESSION: No active cardiopulmonary disease. Electronically Signed   By: Suzen Dials M.D.   On: 02/18/2024 10:31     Assessment and plan- Patient is a 58 y.o. 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 MSI instability testing and NGS testing pending for immunotherapy potential and other targeted agents.  Avastin withheld post-port placement to prevent wound healing issues. Elevated CEA at 388 indicates active disease. - Administer Folfox chemotherapy today. - Withhold Avastin until 7-10 days post-port placement. - Monitor CEA levels with each treatment. - Schedule next chemotherapy session for November 19, with pump removal on November 21.   Cancer-related pain Pain managed with Dilaudid and fentanyl patch. Patient reports regimen is working well, taking less Dilaudid than prescribed and only increasing as needed for pain.  - Continue current pain management regimen with Dilaudid and fentanyl patch. - Notify provider three days before running out of pain medication for timely refills. - Adjust pain medication  regimen if necessary, based on pain levels.  Opioid-induced constipation Constipation due to opioid use was initially managed with Miralax, but patient experienced diarrhea and decreased frequency of bowel movements. Senna was recommended in addition to Miralax to aid in stool propulsion. - Continue Miralax and adjust dosage as needed. - Add Senna to bowel regimen to aid in stool propulsion. - Monitor bowel movement frequency to avoid constipation.  Cough Cough improved but persists with deep breaths, likely due to lymph node involvement. Chemotherapy expected to address cause. - Proceed with chemotherapy as planned.       Visit Diagnosis 1. Colorectal cancer, stage IV (HCC)   2. Encounter for antineoplastic chemotherapy   3. Neoplasm related pain      Dr. Annah Skene,  MD, MPH [facility] at [facility] [phone] 03/01/2024 12:51 PM

## 2024-03-02 ENCOUNTER — Other Ambulatory Visit: Payer: Self-pay

## 2024-03-04 ENCOUNTER — Inpatient Hospital Stay: Attending: Oncology

## 2024-03-04 ENCOUNTER — Telehealth: Payer: Self-pay | Admitting: Licensed Clinical Social Worker

## 2024-03-04 DIAGNOSIS — C7971 Secondary malignant neoplasm of right adrenal gland: Secondary | ICD-10-CM | POA: Insufficient documentation

## 2024-03-04 DIAGNOSIS — C772 Secondary and unspecified malignant neoplasm of intra-abdominal lymph nodes: Secondary | ICD-10-CM | POA: Insufficient documentation

## 2024-03-04 DIAGNOSIS — C7972 Secondary malignant neoplasm of left adrenal gland: Secondary | ICD-10-CM | POA: Insufficient documentation

## 2024-03-04 DIAGNOSIS — Z5111 Encounter for antineoplastic chemotherapy: Secondary | ICD-10-CM | POA: Insufficient documentation

## 2024-03-04 DIAGNOSIS — Z79899 Other long term (current) drug therapy: Secondary | ICD-10-CM | POA: Insufficient documentation

## 2024-03-04 DIAGNOSIS — C787 Secondary malignant neoplasm of liver and intrahepatic bile duct: Secondary | ICD-10-CM | POA: Insufficient documentation

## 2024-03-04 DIAGNOSIS — C20 Malignant neoplasm of rectum: Secondary | ICD-10-CM | POA: Insufficient documentation

## 2024-03-04 NOTE — Telephone Encounter (Signed)
 Called [name] to give brief explanation of genetic testing as she had questions at her infusion appointment Friday. Patient would like to proceed, her blood was already drawn Friday so I will put the [place] order in today.    Dena Cary, MS, [facility] Genetic Counselor [email or web address] Phone: [phone]

## 2024-03-12 ENCOUNTER — Encounter: Payer: Self-pay | Admitting: Licensed Clinical Social Worker

## 2024-03-12 ENCOUNTER — Encounter: Payer: Self-pay | Admitting: Oncology

## 2024-03-12 DIAGNOSIS — Z1379 Encounter for other screening for genetic and chromosomal anomalies: Secondary | ICD-10-CM | POA: Insufficient documentation

## 2024-03-15 ENCOUNTER — Inpatient Hospital Stay

## 2024-03-15 ENCOUNTER — Inpatient Hospital Stay: Admitting: Oncology

## 2024-03-18 ENCOUNTER — Other Ambulatory Visit: Payer: Self-pay | Admitting: Oncology

## 2024-03-18 ENCOUNTER — Telehealth: Payer: Self-pay | Admitting: Licensed Clinical Social Worker

## 2024-03-18 ENCOUNTER — Inpatient Hospital Stay

## 2024-03-18 ENCOUNTER — Encounter: Payer: Self-pay | Admitting: Oncology

## 2024-03-18 DIAGNOSIS — C19 Malignant neoplasm of rectosigmoid junction: Secondary | ICD-10-CM

## 2024-03-18 NOTE — Telephone Encounter (Signed)
 Voicemail received from patient today 03/18/24 at 12:27pm returning a missed call.  Best call back number is [phone]. Forwarded note to [place].

## 2024-03-19 ENCOUNTER — Encounter: Payer: Self-pay | Admitting: Oncology

## 2024-03-19 NOTE — Telephone Encounter (Signed)
 I contacted Ms. Reagle to discuss her genetic testing results. No pathogenic variants were identified in the 40 genes analyzed.   The test report has been scanned into EPIC and is located under the Molecular Pathology section of the Results Review tab.  A portion of the result report is included below for reference.      Dena Cary, MS, [facility] Genetic Counselor [email or web address] Phone: [phone]

## 2024-03-20 ENCOUNTER — Inpatient Hospital Stay

## 2024-03-20 ENCOUNTER — Inpatient Hospital Stay (HOSPITAL_BASED_OUTPATIENT_CLINIC_OR_DEPARTMENT_OTHER): Admitting: Hospice and Palliative Medicine

## 2024-03-20 ENCOUNTER — Telehealth: Payer: Self-pay

## 2024-03-20 ENCOUNTER — Inpatient Hospital Stay (HOSPITAL_BASED_OUTPATIENT_CLINIC_OR_DEPARTMENT_OTHER): Admitting: Oncology

## 2024-03-20 ENCOUNTER — Encounter: Payer: Self-pay | Admitting: Oncology

## 2024-03-20 VITALS — BP 133/59 | HR 85 | Temp 97.8°F | Resp 19 | Ht 70.0 in | Wt 311.4 lb

## 2024-03-20 DIAGNOSIS — C20 Malignant neoplasm of rectum: Secondary | ICD-10-CM | POA: Diagnosis present

## 2024-03-20 DIAGNOSIS — C19 Malignant neoplasm of rectosigmoid junction: Secondary | ICD-10-CM

## 2024-03-20 DIAGNOSIS — Z5111 Encounter for antineoplastic chemotherapy: Secondary | ICD-10-CM

## 2024-03-20 DIAGNOSIS — C7972 Secondary malignant neoplasm of left adrenal gland: Secondary | ICD-10-CM | POA: Diagnosis present

## 2024-03-20 DIAGNOSIS — G893 Neoplasm related pain (acute) (chronic): Secondary | ICD-10-CM

## 2024-03-20 DIAGNOSIS — C7971 Secondary malignant neoplasm of right adrenal gland: Secondary | ICD-10-CM | POA: Diagnosis not present

## 2024-03-20 DIAGNOSIS — C772 Secondary and unspecified malignant neoplasm of intra-abdominal lymph nodes: Secondary | ICD-10-CM | POA: Diagnosis not present

## 2024-03-20 DIAGNOSIS — G620 Drug-induced polyneuropathy: Secondary | ICD-10-CM | POA: Diagnosis not present

## 2024-03-20 DIAGNOSIS — Z79899 Other long term (current) drug therapy: Secondary | ICD-10-CM | POA: Diagnosis not present

## 2024-03-20 DIAGNOSIS — C787 Secondary malignant neoplasm of liver and intrahepatic bile duct: Secondary | ICD-10-CM | POA: Diagnosis not present

## 2024-03-20 DIAGNOSIS — T451X5A Adverse effect of antineoplastic and immunosuppressive drugs, initial encounter: Secondary | ICD-10-CM | POA: Diagnosis not present

## 2024-03-20 LAB — CBC WITH DIFFERENTIAL (CANCER CENTER ONLY)
Abs Immature Granulocytes: 0.05 K/uL (ref 0.00–0.07)
Basophils Absolute: 0.1 K/uL (ref 0.0–0.1)
Basophils Relative: 2 %
Eosinophils Absolute: 0.2 K/uL (ref 0.0–0.5)
Eosinophils Relative: 3 %
HCT: 40.1 % (ref 36.0–46.0)
Hemoglobin: 12.7 g/dL (ref 12.0–15.0)
Immature Granulocytes: 1 %
Lymphocytes Relative: 33 %
Lymphs Abs: 2.1 K/uL (ref 0.7–4.0)
MCH: 25.5 pg — ABNORMAL LOW (ref 26.0–34.0)
MCHC: 31.7 g/dL (ref 30.0–36.0)
MCV: 80.4 fL (ref 80.0–100.0)
Monocytes Absolute: 0.8 K/uL (ref 0.1–1.0)
Monocytes Relative: 13 %
Neutro Abs: 3.1 K/uL (ref 1.7–7.7)
Neutrophils Relative %: 48 %
Platelet Count: 316 K/uL (ref 150–400)
RBC: 4.99 MIL/uL (ref 3.87–5.11)
RDW: 16.7 % — ABNORMAL HIGH (ref 11.5–15.5)
WBC Count: 6.4 K/uL (ref 4.0–10.5)
nRBC: 0 % (ref 0.0–0.2)

## 2024-03-20 LAB — CMP (CANCER CENTER ONLY)
ALT: 21 U/L (ref 0–44)
AST: 42 U/L — ABNORMAL HIGH (ref 15–41)
Albumin: 3.4 g/dL — ABNORMAL LOW (ref 3.5–5.0)
Alkaline Phosphatase: 103 U/L (ref 38–126)
Anion gap: 10 (ref 5–15)
BUN: 17 mg/dL (ref 6–20)
CO2: 27 mmol/L (ref 22–32)
Calcium: 9.2 mg/dL (ref 8.9–10.3)
Chloride: 97 mmol/L — ABNORMAL LOW (ref 98–111)
Creatinine: 0.73 mg/dL (ref 0.44–1.00)
GFR, Estimated: >60 mL/min (ref >60)
Glucose, Bld: 141 mg/dL — ABNORMAL HIGH (ref 70–99)
Potassium: 3.8 mmol/L (ref 3.5–5.1)
Sodium: 134 mmol/L — ABNORMAL LOW (ref 135–145)
Total Bilirubin: 0.5 mg/dL (ref 0.0–1.2)
Total Protein: 7.3 g/dL (ref 6.5–8.1)

## 2024-03-20 LAB — MAGNESIUM: Magnesium: 1.9 mg/dL (ref 1.7–2.4)

## 2024-03-20 MED ORDER — GABAPENTIN 100 MG PO CAPS: 100.0000 mg | ORAL_CAPSULE | Freq: Every day | ORAL | 0 refills | Status: AC

## 2024-03-20 MED ORDER — SODIUM CHLORIDE 0.9 % IV SOLN
2400.0000 mg/m2 | INTRAVENOUS | Status: DC
  Administered 2024-03-20: 7000 mg via INTRAVENOUS
  Filled 2024-03-20: qty 140

## 2024-03-20 MED ORDER — HYDROCODONE BIT-HOMATROP MBR 5-1.5 MG/5ML PO SOLN: 5.0000 mL | Freq: Four times a day (QID) | ORAL | 0 refills | Status: AC | PRN

## 2024-03-20 MED ORDER — DEXAMETHASONE SOD PHOSPHATE PF 10 MG/ML IJ SOLN
10.0000 mg | Freq: Once | INTRAMUSCULAR | Status: AC
  Administered 2024-03-20: 10 mg via INTRAVENOUS

## 2024-03-20 MED ORDER — OXALIPLATIN CHEMO INJECTION 100 MG/20ML
65.0000 mg/m2 | Freq: Once | INTRAVENOUS | Status: AC
  Administered 2024-03-20: 170 mg via INTRAVENOUS
  Filled 2024-03-20: qty 34

## 2024-03-20 MED ORDER — FENTANYL 12 MCG/HR TD PT72: 1.0000 | MEDICATED_PATCH | TRANSDERMAL | 0 refills | Status: DC

## 2024-03-20 MED ORDER — FLUOROURACIL CHEMO INJECTION 2.5 GM/50ML
400.0000 mg/m2 | Freq: Once | INTRAVENOUS | Status: AC
  Administered 2024-03-20: 1000 mg via INTRAVENOUS
  Filled 2024-03-20: qty 20

## 2024-03-20 MED ORDER — LEUCOVORIN CALCIUM INJECTION 350 MG
400.0000 mg/m2 | Freq: Once | INTRAVENOUS | Status: AC
  Administered 2024-03-20: 1056 mg via INTRAVENOUS
  Filled 2024-03-20: qty 50

## 2024-03-20 MED ORDER — DEXTROSE 5 % IV SOLN
INTRAVENOUS | Status: DC
  Filled 2024-03-20: qty 250

## 2024-03-20 MED ORDER — PALONOSETRON HCL INJECTION 0.25 MG/5ML
0.2500 mg | Freq: Once | INTRAVENOUS | Status: AC
  Administered 2024-03-20: 0.25 mg via INTRAVENOUS
  Filled 2024-03-20: qty 5

## 2024-03-20 NOTE — Progress Notes (Signed)
 Hematology/Oncology Consult note [facility]  Telephone:([phone] [phone]  Patient Care Team: Patient, No Pcp Per as PCP - General (General Practice) Maurie Rayfield BIRCH, RN as Oncology Nurse Navigator Melanee Annah BROCKS, MD as Consulting Physician (Oncology)   Name of the patient: Melissa Johns  [id number]  [birth year]   Date of visit: 03/20/24  Diagnosis-  Cancer Staging  Colorectal cancer, stage IV [facility]) Staging form: Colon and Rectum, AJCC 8th Edition - Clinical stage from 02/23/2024: Stage IVC (cTX, cN2b, pM1c) - Signed by Melanee Annah BROCKS, MD on 02/25/2024    Chief complaint/ Reason for visit-on treatment assessment prior to cycle 2 of palliative FOLFOX chemotherapy  Heme/Onc history:  patient is a 58 year old female with a past medical history significant for hypertension who presented with symptoms of epigastric and right upper quadrant abdominal pain which has been ongoing for 2 weeks.She underwent CT abdomen pelvis with contrast in the ER which showed significant thoracic adenopathy, multiple hepatic masses.  Left greater than right adrenal nodules.  There was evidence of intra-abdominal adenopathy including index gastrohepatic lymph node measuring 2.2 cm.  Extensive adenopathy within the sigmoid mesocolon with a nodal mass of 3.8 x 4.2 cm.  Findings concerning for rectosigmoid primary given the eccentric right wall thickening.   CT angio chest did not show any evidence of large central pulmonary embolus or subsegmental pulmonary embolism.  Bulky mediastinal and bilateral hilar adenopathy. Ultrasound-guided liver biopsy showed metastatic moderately differentiated adenocarcinoma consistent with colorectal primary.  Focal CK7 positive bile ducts.  Cells positive for CK20 and CDX2 and negative for CK7 GATA3 PAX8 and TTF-1.  Tempus testing on the specimen showed negative for KRAS and NRAS.  APC, T p53 and SMAD4 mutation.  DP YD normal.  MSI stable.   HER2 equivocal by IHC and negative by FISH.  Patient was started on palliative FOLFOX chemotherapy on 03/01/2024  Interval history- Discussed the use of AI scribe software for clinical note transcription with the patient, who gave verbal consent to proceed.  History of Present Illness   Melissa Johns is a 58 year old female with colon cancer who presents for follow-up regarding chemotherapy treatment.  She has been experiencing a cough, which has improved with the use of nighttime cough medicine. She is on her last dose and is uncertain if she needs more.  For pain management, she uses a fentanyl  patch, which she finds effective, and takes Dilaudid  primarily in the morning and occasionally at night, depending on her activity level. She works part-time from home and occasionally goes into the office but is not comfortable driving, so she is dropped off for half-day shifts. She experiences fatigue and sometimes needs to rest during the day.  She reports tingling in her feet, particularly in her toes and the bottom of her feet, described as a cold sensation. This sensation is persistent, although she can feel pressure on her feet at times. She has received one dose of oxaliplatin .  She has a history of hemorrhoids, which she has had for a long time, experiencing some bleeding. She uses over-the-counter Preparation H, which soothes but does not shrink them. She has not seen a doctor for this issue and is not currently interested in further intervention.  She experiences gastrointestinal symptoms such as gas, indigestion, and acid reflux, particularly with uncooked vegetables, which she avoids.     ECOG PS- 1 Pain scale- 0   Review of systems- Review of Systems  Constitutional:  Positive for malaise/fatigue. Negative for chills, fever and weight loss.  HENT:  Negative for congestion, ear discharge and nosebleeds.   Eyes:  Negative for blurred vision.  Respiratory:  Negative for cough,  hemoptysis, sputum production, shortness of breath and wheezing.   Cardiovascular:  Negative for chest pain, palpitations, orthopnea and claudication.  Gastrointestinal:  Positive for abdominal pain. Negative for blood in stool, constipation, diarrhea, heartburn, melena, nausea and vomiting.  Genitourinary:  Negative for dysuria, flank pain, frequency, hematuria and urgency.  Musculoskeletal:  Negative for back pain, joint pain and myalgias.  Skin:  Negative for rash.  Neurological:  Positive for sensory change (Peripheral neuropathy). Negative for dizziness, tingling, focal weakness, seizures, weakness and headaches.  Endo/Heme/Allergies:  Does not bruise/bleed easily.  Psychiatric/Behavioral:  Negative for depression and suicidal ideas. The patient does not have insomnia.       Allergies  Allergen Reactions   Penicillins Other (See Comments)    joints lock up  Joints lock becomes immobile, joint pain     Past Medical History:  Diagnosis Date   Ectopic pregnancy    Hypertension    Miscarriage      Past Surgical History:  Procedure Laterality Date   CYST REMOVAL HAND Right    IR IMAGING GUIDED PORT INSERTION  02/27/2024   LIVER BIOPSY     SALPINGECTOMY     Etopic Pregnancy    Social History   Socioeconomic History   Marital status: Married    Spouse name: Ozell   Number of children: 3   Years of education: Not on file   Highest education level: Not on file  Occupational History   Not on file  Tobacco Use   Smoking status: Former    Types: Cigarettes   Smokeless tobacco: Never  Vaping Use   Vaping status: Former   Quit date: 02/13/2024  Substance and Sexual Activity   Alcohol use: No   Drug use: No   Sexual activity: Yes  Other Topics Concern   Not on file  Social History Narrative   Not on file   Social Drivers of Health   Financial Resource Strain: Not on file  Food Insecurity: No Food Insecurity (02/23/2024)   Hunger Vital Sign    Worried  About Running Out of Food in the Last Year: Never true    Ran Out of Food in the Last Year: Never true  Transportation Needs: No Transportation Needs (02/23/2024)   PRAPARE - Administrator, Civil Service (Medical): No    Lack of Transportation (Non-Medical): No  Physical Activity: Not on file  Stress: Not on file  Social Connections: Not on file  Intimate Partner Violence: Not At Risk (02/23/2024)   Humiliation, Afraid, Rape, and Kick questionnaire    Fear of Current or Ex-Partner: No    Emotionally Abused: No    Physically Abused: No    Sexually Abused: No    Family History  Problem Relation Age of Onset   Aneurysm Mother    Hypertension Mother    Heart Problems Father    Breast cancer Sister 53   COPD Half-Sister      Current Outpatient Medications:    HYDROmorphone (DILAUDID) 2 MG tablet, Take 1 tablet (2 mg total) by mouth every 3 (three) hours., Disp: 90 tablet, Rfl: 0   lisinopril-hydrochlorothiazide (ZESTORETIC) 20-12.5 MG tablet, Take 1 tablet by mouth daily., Disp: 30 tablet, Rfl: 2   polyethylene glycol (MIRALAX) 17 g packet, Take 17 g by  mouth 2 (two) times daily., Disp: 180 packet, Rfl: 0   fentaNYL  (DURAGESIC ) 12 MCG/HR, Place 1 patch onto the skin every 3 (three) days., Disp: 10 patch, Rfl: 0   gabapentin  (NEURONTIN ) 100 MG capsule, Take 1 capsule (100 mg total) by mouth at bedtime., Disp: 30 capsule, Rfl: 0   HYDROcodone  bit-homatropine (HYCODAN) 5-1.5 MG/5ML syrup, Take 5 mLs by mouth every 6 (six) hours as needed for cough., Disp: 120 mL, Rfl: 0 No current facility-administered medications for this visit.  Facility-Administered Medications Ordered in Other Visits:    dextrose  5 % solution, , Intravenous, Continuous, Melanee Annah BROCKS, MD, Last Rate: 10 mL/hr at 03/20/24 0939, New Bag at 03/20/24 0939   fluorouracil  (ADRUCIL ) 7,000 mg in sodium chloride  0.9 % 110 mL chemo infusion, 2,400 mg/m2 (Treatment Plan Recorded), Intravenous, 1 day or 1 dose, Melanee Annah BROCKS, MD   fluorouracil  (ADRUCIL ) chemo injection 1,000 mg, 400 mg/m2 (Treatment Plan Recorded), Intravenous, Once, Melanee Annah BROCKS, MD   leucovorin  1,056 mg in dextrose  5 % 250 mL infusion, 400 mg/m2 (Treatment Plan Recorded), Intravenous, Once, Melanee Annah BROCKS, MD, Last Rate: 151 mL/hr at 03/20/24 1028, 1,056 mg at 03/20/24 1028   oxaliplatin  (ELOXATIN ) 170 mg in dextrose  5 % 500 mL chemo infusion, 65 mg/m2 (Treatment Plan Recorded), Intravenous, Once, Melanee Annah BROCKS, MD, Last Rate: 267 mL/hr at 03/20/24 1031, 170 mg at 03/20/24 1031  Physical exam:  Vitals:   03/20/24 0852  BP: (!) 133/59  Pulse: 85  Resp: 19  Temp: 97.8 F (36.6 C)  TempSrc: Tympanic  SpO2: 96%  Weight: (!) 311 lb 6.4 oz (141.3 kg)  Height: 5' 10 (1.778 m)   Physical Exam Cardiovascular:     Rate and Rhythm: Normal rate and regular rhythm.     Heart sounds: Normal heart sounds.  Pulmonary:     Effort: Pulmonary effort is normal.     Breath sounds: Normal breath sounds.  Skin:    General: Skin is warm and dry.  Neurological:     Mental Status: She is alert and oriented to person, place, and time.      I have personally reviewed labs listed below:    Latest Ref Rng & Units 03/20/2024    8:33 AM  CMP  Glucose 70 - 99 mg/dL 858   BUN 6 - 20 mg/dL 17   Creatinine 9.55 - 1.00 mg/dL 9.26   Sodium 864 - 854 mmol/L 134   Potassium 3.5 - 5.1 mmol/L 3.8   Chloride 98 - 111 mmol/L 97   CO2 22 - 32 mmol/L 27   Calcium  8.9 - 10.3 mg/dL 9.2   Total Protein 6.5 - 8.1 g/dL 7.3   Total Bilirubin 0.0 - 1.2 mg/dL 0.5   Alkaline Phos 38 - 126 U/L 103   AST 15 - 41 U/L 42   ALT 0 - 44 U/L 21       Latest Ref Rng & Units 03/20/2024    8:33 AM  CBC  WBC 4.0 - 10.5 K/uL 6.4   Hemoglobin 12.0 - 15.0 g/dL 87.2   Hematocrit 63.9 - 46.0 % 40.1   Platelets 150 - 400 K/uL 316    I have personally reviewed Radiology images listed below: No images are attached to the encounter.  IR IMAGING GUIDED PORT  INSERTION Result Date: 02/27/2024 INDICATION: chemotherapy administration.  History of colon cancer. EXAM: IMPLANTED PORT A CATH PLACEMENT WITH ULTRASOUND AND FLUOROSCOPIC GUIDANCE MEDICATIONS: None ANESTHESIA/SEDATION: Moderate (conscious) sedation was  employed during this procedure. A total of Versed 2 mg and Fentanyl 100 mcg was administered intravenously. Moderate Sedation Time: 22 minutes. The patient's level of consciousness and vital signs were monitored continuously by radiology nursing throughout the procedure under my direct supervision. FLUOROSCOPY: Radiation Exposure Index and estimated peak skin dose (PSD); Reference air kerma (RAK), 1 mGy. COMPLICATIONS: None immediate. PROCEDURE: The procedure, risks, benefits, and alternatives were explained to the patient. Questions regarding the procedure were encouraged and answered. The patient understands and consents to the procedure. The RIGHT neck and chest were prepped with chlorhexidine in a sterile fashion, and a sterile drape was applied covering the operative field. Maximum barrier sterile technique with sterile gowns and gloves were used for the procedure. A timeout was performed prior to the initiation of the procedure. Local anesthesia was provided with 1% lidocaine with epinephrine. After creating a small venotomy incision, a micropuncture kit was utilized to access the internal jugular vein under direct, real-time ultrasound guidance. Ultrasound image documentation was performed. The microwire was kinked to measure appropriate catheter length. A subcutaneous port pocket was then created along the upper chest wall utilizing a combination of sharp and blunt dissection. The pocket was irrigated with sterile saline. A single lumen power injectable port was chosen for placement. The 8 Fr catheter was tunneled from the port pocket site to the venotomy incision. The port was placed in the pocket. The external catheter was trimmed to appropriate length.  At the venotomy, an 8 Fr peel-away sheath was placed over a guidewire under fluoroscopic guidance. The catheter was then placed through the sheath and the sheath was removed. Final catheter positioning was confirmed and documented with a fluoroscopic spot radiograph. The port was accessed with a Huber needle, aspirated and flushed with heparinized saline. The port pocket incision was closed with interrupted 3-0 Vicryl suture then Dermabond was applied, including at the venotomy incision. Dressings were placed. The patient tolerated the procedure well without immediate post procedural complication. IMPRESSION: Successful placement of a RIGHT internal jugular approach power injectable Port-A-Cath. The tip of the catheter is positioned at the superior cavo-atrial junction. The catheter is ready for immediate use. Thom Hall, MD [facility] Radiology Electronically Signed   By: Thom Hall M.D.   On: 02/27/2024 15:05   CT ABDOMEN PELVIS W CONTRAST Result Date: 02/21/2024 CLINICAL DATA:  History of liver biopsy yesterday with persistent upper abdominal pain, initial encounter EXAM: CT ABDOMEN AND PELVIS WITH CONTRAST TECHNIQUE: Multidetector CT imaging of the abdomen and pelvis was performed using the standard protocol following bolus administration of intravenous contrast. RADIATION DOSE REDUCTION: This exam was performed according to the departmental dose-optimization program which includes automated exposure control, adjustment of the mA and/or kV according to patient size and/or use of iterative reconstruction technique. CONTRAST:  OMNIPAQUE IOHEXOL 350 MG/ML SOLN COMPARISON:  02/18/2024 FINDINGS: Lower chest: Lung bases are free of acute infiltrate. Mediastinal and hilar adenopathy is again similar to that seen on recent exam. Hepatobiliary: Liver is well visualized and demonstrates multiple scattered hypodensities throughout the liver. These appear worst  in the left lobe with changes of very mild ductal dilatation in the left lobe. This is similar to that seen on recent exam. The gallbladder is partially distended. Pancreas: Unremarkable. No pancreatic ductal dilatation or surrounding inflammatory changes. Spleen: Normal in size without focal abnormality. Adrenals/Urinary Tract: Adrenal nodules are again identified bilaterally unchanged from the exam 3 days previous. Kidneys demonstrate a normal enhancement pattern.  Peripelvic cyst is again identified in the upper pole of the right kidney stable from the prior exam. No renal calculi or obstructive changes are seen. The bladder is within normal limits. Stomach/Bowel: No obstructive or inflammatory changes of the colon are noted. There is persistent wall thickening identified in the rectum but less well visualized. Some eccentric wall thickening is noted in the rectosigmoid stable from the prior exam. The appendix is within normal limits. Small bowel and stomach appear within normal limits. Vascular/Lymphatic: No aneurysmal dilatation is noted. Mild atherosclerotic calcifications are seen. Extensive lymphadenopathy is identified in the periaortic region of the upper abdomen as well as in the intra-aortocaval region. Soft tissue masses are noted adjacent to the sigmoid colon best seen on image number 73 of series 2 stable from the prior exam. Reproductive: Uterus appears within normal limits. Other: No free fluid is noted.  No herniation is seen. Musculoskeletal: No acute bony abnormality is noted. IMPRESSION: Extensive changes consistent with the known history of metastatic disease. These changes are stable in the interval from 2 days previous. This is likely of colonic origin given the changes in the rectosigmoid and rectum. Biopsy results from the liver are pending. Mild left biliary ductal dilatation related to the underlying hepatic metastatic disease. This is stable from the prior exam. No significant ascites. No  findings to suggest post biopsy hemorrhage or other complication. Electronically Signed   By: Oneil Devonshire M.D.   On: 02/21/2024 00:31   US  BIOPSY (LIVER) Result Date: 02/19/2024 INDICATION: Masses of liver and lymphadenopathy and pulmonary nodules in the chest. The patient presents for liver biopsy. EXAM: ULTRASOUND GUIDED CORE BIOPSY OF LIVER MEDICATIONS: None. ANESTHESIA/SEDATION: Moderate (conscious) sedation was employed during this procedure. A total of Versed  1.0 mg and Fentanyl  50 mcg was administered intravenously. Moderate Sedation Time: 13 minutes. The patient's level of consciousness and vital signs were monitored continuously by radiology nursing throughout the procedure under my direct supervision. PROCEDURE: The procedure, risks, benefits, and alternatives were explained to the patient. Questions regarding the procedure were encouraged and answered. The patient understands and consents to the procedure. A time out was performed prior to initiating the procedure. The abdominal wall was prepped with chlorhexidine in a sterile fashion, and a sterile drape was applied covering the operative field. A sterile gown and sterile gloves were used for the procedure. Local anesthesia was provided with 1% Lidocaine . Ultrasound was performed of the liver to localize lesions. A site was chosen over the anterior abdominal wall for biopsy. Under ultrasound guidance, a 17 gauge trocar needle was advanced into the left lobe of the liver. After confirming needle tip position, 3 separate coaxial 18 gauge core biopsy samples were obtained of a left lobe liver lesion. Samples were submitted in formalin. A slurry of Gel-Foam EmboCubes mixed with sterile saline was then injected as the outer needle was retracted and removed. Additional ultrasound was performed COMPLICATIONS: None immediate. FINDINGS: Large ill-defined hyperechoic mass in the left lobe of the liver measuring roughly 12 cm and smaller right lobe mass were  localized by ultrasound. The left lobe lesion was chosen for biopsy. Solid tissue was obtained. IMPRESSION: Ultrasound-guided core biopsy performed of a large mass in the left lobe of the liver measuring up to roughly 12 cm. Electronically Signed   By: Marcey Moan M.D.   On: 02/19/2024 13:09     Assessment and plan- Patient is a 58 y.o. 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 CT indicates left-sided tumor. Panitumumab preferred for left-sided tumors. Insurance approval pending, expected in two weeks. Main side effect is skin rash and diarrhea. - Administered FOLFOX chemotherapy today.  I am holding off on giving her a Avastin today as the plan is to do FOLFOX panitumumab chemotherapy subsequently. - Add panitumumab to FOLFOX in two weeks pending insurance approval. - Monitor for skin rash, manage with creams and doxycycline if needed. - Checked CEA levels today to assess treatment response.  Chemotherapy-induced peripheral neuropathy Tingling in feet likely due to oxaliplatin . Reduced oxaliplatin  dose to mitigate neuropathy. - Reduced dose of oxaliplatin  to 65 mg/m - Monitor neuropathy symptoms, consider medication if symptoms worsen.  Hemorrhoids with intermittent bleeding Symptoms managed with Preparation H and sitz baths. Banding discussed if symptoms worsen. - Continue using Preparation H and sitz baths. - Consider witch hazel wipes for additional relief.  Chronic abdominal pain, managed with fentanyl  and dilaudid  Pain managed with fentanyl  patches and dilaudid . Fentanyl  patch running low. - Sent prescription for fentanyl  patch. - Continue current pain  management regimen with dilaudid .         Visit Diagnosis 1. Colorectal cancer, stage IV (HCC)   2. Encounter for antineoplastic chemotherapy   3. Chemotherapy-induced peripheral neuropathy      Dr. Annah Skene, MD, MPH [facility] at [facility] [phone] 03/20/2024 11:08 AM

## 2024-03-20 NOTE — Progress Notes (Signed)
 Palliative Medicine [facility] at [facility] [phone] [phone]   Name: Melissa Johns Date: 03/20/2024 MRN: [id number]  DOB: January 14, 1966  Patient Care Team: Patient, No Pcp Per as PCP - General (General Practice) Maurie Rayfield BIRCH, RN as Oncology Nurse Navigator Melanee Annah BROCKS, MD as Consulting Physician (Oncology)    REASON FOR CONSULTATION: Melissa Johns is a 58 y.o. 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  Patient was referred to palliative care to address goals of management symptoms.  SOCIAL HISTORY:     reports that she has quit smoking. Her smoking use included cigarettes. She has never used smokeless tobacco. She reports that she does not drink alcohol and does not use drugs.  Patient is married and lives at home with her husband.  She has 3 daughters.  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  ADVANCE DIRECTIVES:    CODE STATUS:   PAST MEDICAL HISTORY: Past Medical History:  Diagnosis Date   Ectopic pregnancy    Hypertension    Miscarriage     PAST SURGICAL HISTORY:  Past Surgical History:  Procedure Laterality Date   CYST REMOVAL HAND Right    IR IMAGING GUIDED PORT INSERTION  02/27/2024   LIVER BIOPSY     SALPINGECTOMY     Etopic Pregnancy    HEMATOLOGY/ONCOLOGY HISTORY:  Oncology History  Colorectal cancer, stage IV (HCC)  02/18/2024 Initial Diagnosis   Colorectal cancer, stage IV (HCC)   02/23/2024 Cancer Staging   Staging form: Colon and Rectum, AJCC 8th Edition - Clinical stage from 02/23/2024: Stage IVC (cTX, cN2b, pM1c) - Signed by Melanee Annah BROCKS, MD on 02/25/2024   03/01/2024 - 03/04/2024 Chemotherapy   Patient is on Treatment Plan : COLORECTAL FOLFOX + Bevacizumab q14d     03/10/2024 Genetic Testing   Negative genetic testing. No pathogenic variants identified on the [employer] CancerNext+RNA Panel. The report  date is 03/10/2024.  The [employer] CancerNext+RNAinsight Panel includes sequencing, rearrangement analysis, and RNA analysis for the following 40 genes: APC, ATM, BAP1, BARD1, BMPR1A, BRCA1, BRCA2, BRIP1, CDH1, CDKN2A, CHEK2, FH, FLCN, MET, MLH1, MSH2, MSH6, MUTYH, NF1, NTHL1, PALB2, PMS2, PTEN, RAD51C, RAD51D, RPS20, SMAD4, STK11, TP53, TSC1, TSC2, and VHL (sequencing and deletion/duplication); AXIN2, HOXB13, MBD4, MSH3, POLD1 and POLE (sequencing only); EPCAM and GREM1 (deletion/duplication only).   03/20/2024 -  Chemotherapy   Patient is on Treatment Plan : COLORECTAL FOLFOX + Panitumumab q14d       ALLERGIES:  is allergic to penicillins.  MEDICATIONS:  Current Outpatient Medications  Medication Sig Dispense Refill   gabapentin (NEURONTIN) 100 MG capsule Take 1 capsule (100 mg total) by mouth at bedtime. 30 capsule 0   fentaNYL (DURAGESIC) 12 MCG/HR Place 1 patch onto the skin every 3 (three) days. 10 patch 0   HYDROcodone bit-homatropine (HYCODAN) 5-1.5 MG/5ML syrup Take 5 mLs by mouth every 6 (six) hours as needed for cough. 120 mL 0   HYDROmorphone (DILAUDID) 2 MG tablet Take 1 tablet (2 mg total) by mouth every 3 (three) hours. 90 tablet 0   lisinopril-hydrochlorothiazide (ZESTORETIC) 20-12.5 MG tablet Take 1 tablet by mouth daily. 30 tablet 2   polyethylene glycol (MIRALAX) 17 g packet Take 17 g by mouth 2 (two) times daily. 180 packet 0   No current facility-administered medications for this visit.   Facility-Administered Medications Ordered in Other Visits  Medication Dose Route Frequency Provider Last  Rate Last Admin   dextrose 5 % solution   Intravenous Continuous Melanee Annah BROCKS, MD 10 mL/hr at 03/20/24 0939 New Bag at 03/20/24 0939   fluorouracil (ADRUCIL) 7,000 mg in sodium chloride 0.9 % 110 mL chemo infusion  2,400 mg/m2 (Treatment Plan Recorded) Intravenous 1 day or 1 dose Rao, Archana C, MD       fluorouracil (ADRUCIL) chemo injection 1,000 mg  400 mg/m2 (Treatment Plan  Recorded) Intravenous Once Rao, Archana C, MD       leucovorin 1,056 mg in dextrose 5 % 250 mL infusion  400 mg/m2 (Treatment Plan Recorded) Intravenous Once Rao, Archana C, MD 151 mL/hr at 03/20/24 1028 1,056 mg at 03/20/24 1028   oxaliplatin (ELOXATIN) 170 mg in dextrose 5 % 500 mL chemo infusion  65 mg/m2 (Treatment Plan Recorded) Intravenous Once Melanee Annah BROCKS, MD 267 mL/hr at 03/20/24 1031 170 mg at 03/20/24 1031    VITAL SIGNS: LMP 12/21/2015  There were no vitals filed for this visit.  Estimated body mass index is 44.68 kg/m as calculated from the following:   Height as of an earlier encounter on 03/20/24: 5' 10 (1.778 m).   Weight as of an earlier encounter on 03/20/24: 311 lb 6.4 oz (141.3 kg).  LABS: CBC:    Component Value Date/Time   WBC 6.4 03/20/2024 0833   WBC 13.7 (H) 02/20/2024 1907   HGB 12.7 03/20/2024 0833   HCT 40.1 03/20/2024 0833   PLT 316 03/20/2024 0833   MCV 80.4 03/20/2024 0833   NEUTROABS 3.1 03/20/2024 0833   LYMPHSABS 2.1 03/20/2024 0833   MONOABS 0.8 03/20/2024 0833   EOSABS 0.2 03/20/2024 0833   BASOSABS 0.1 03/20/2024 0833   Comprehensive Metabolic Panel:    Component Value Date/Time   NA 134 (L) 03/20/2024 0833   K 3.8 03/20/2024 0833   CL 97 (L) 03/20/2024 0833   CO2 27 03/20/2024 0833   BUN 17 03/20/2024 0833   CREATININE 0.73 03/20/2024 0833   GLUCOSE 141 (H) 03/20/2024 0833   CALCIUM 9.2 03/20/2024 0833   AST 42 (H) 03/20/2024 0833   ALT 21 03/20/2024 0833   ALKPHOS 103 03/20/2024 0833   BILITOT 0.5 03/20/2024 0833   PROT 7.3 03/20/2024 0833   ALBUMIN 3.4 (L) 03/20/2024 0833    RADIOGRAPHIC STUDIES: IR IMAGING GUIDED PORT INSERTION Result Date: 02/27/2024 INDICATION: chemotherapy administration.  History of colon cancer. EXAM: IMPLANTED PORT A CATH PLACEMENT WITH ULTRASOUND AND FLUOROSCOPIC GUIDANCE MEDICATIONS: None ANESTHESIA/SEDATION: Moderate (conscious) sedation was employed during this procedure. A total of Versed 2 mg  and Fentanyl 100 mcg was administered intravenously. Moderate Sedation Time: 22 minutes. The patient's level of consciousness and vital signs were monitored continuously by radiology nursing throughout the procedure under my direct supervision. FLUOROSCOPY: Radiation Exposure Index and estimated peak skin dose (PSD); Reference air kerma (RAK), 1 mGy. COMPLICATIONS: None immediate. PROCEDURE: The procedure, risks, benefits, and alternatives were explained to the patient. Questions regarding the procedure were encouraged and answered. The patient understands and consents to the procedure. The RIGHT neck and chest were prepped with chlorhexidine in a sterile fashion, and a sterile drape was applied covering the operative field. Maximum barrier sterile technique with sterile gowns and gloves were used for the procedure. A timeout was performed prior to the initiation of the procedure. Local anesthesia was provided with 1% lidocaine with epinephrine. After creating a small venotomy incision, a micropuncture kit was utilized to access the internal jugular vein under direct, real-time ultrasound  guidance. Ultrasound image documentation was performed. The microwire was kinked to measure appropriate catheter length. A subcutaneous port pocket was then created along the upper chest wall utilizing a combination of sharp and blunt dissection. The pocket was irrigated with sterile saline. A single lumen power injectable port was chosen for placement. The 8 Fr catheter was tunneled from the port pocket site to the venotomy incision. The port was placed in the pocket. The external catheter was trimmed to appropriate length. At the venotomy, an 8 Fr peel-away sheath was placed over a guidewire under fluoroscopic guidance. The catheter was then placed through the sheath and the sheath was removed. Final catheter positioning was confirmed and documented with a fluoroscopic spot radiograph. The port was accessed with a Huber needle,  aspirated and flushed with heparinized saline. The port pocket incision was closed with interrupted 3-0 Vicryl suture then Dermabond was applied, including at the venotomy incision. Dressings were placed. The patient tolerated the procedure well without immediate post procedural complication. IMPRESSION: Successful placement of a RIGHT internal jugular approach power injectable Port-A-Cath. The tip of the catheter is positioned at the superior cavo-atrial junction. The catheter is ready for immediate use. Thom Hall, MD [facility] Radiology Electronically Signed   By: Thom Hall M.D.   On: 02/27/2024 15:05   CT ABDOMEN PELVIS W CONTRAST Result Date: 02/21/2024 CLINICAL DATA:  History of liver biopsy yesterday with persistent upper abdominal pain, initial encounter EXAM: CT ABDOMEN AND PELVIS WITH CONTRAST TECHNIQUE: Multidetector CT imaging of the abdomen and pelvis was performed using the standard protocol following bolus administration of intravenous contrast. RADIATION DOSE REDUCTION: This exam was performed according to the departmental dose-optimization program which includes automated exposure control, adjustment of the mA and/or kV according to patient size and/or use of iterative reconstruction technique. CONTRAST:  OMNIPAQUE IOHEXOL 350 MG/ML SOLN COMPARISON:  02/18/2024 FINDINGS: Lower chest: Lung bases are free of acute infiltrate. Mediastinal and hilar adenopathy is again similar to that seen on recent exam. Hepatobiliary: Liver is well visualized and demonstrates multiple scattered hypodensities throughout the liver. These appear worst in the left lobe with changes of very mild ductal dilatation in the left lobe. This is similar to that seen on recent exam. The gallbladder is partially distended. Pancreas: Unremarkable. No pancreatic ductal dilatation or surrounding inflammatory changes. Spleen: Normal in size without focal abnormality.  Adrenals/Urinary Tract: Adrenal nodules are again identified bilaterally unchanged from the exam 3 days previous. Kidneys demonstrate a normal enhancement pattern. Peripelvic cyst is again identified in the upper pole of the right kidney stable from the prior exam. No renal calculi or obstructive changes are seen. The bladder is within normal limits. Stomach/Bowel: No obstructive or inflammatory changes of the colon are noted. There is persistent wall thickening identified in the rectum but less well visualized. Some eccentric wall thickening is noted in the rectosigmoid stable from the prior exam. The appendix is within normal limits. Small bowel and stomach appear within normal limits. Vascular/Lymphatic: No aneurysmal dilatation is noted. Mild atherosclerotic calcifications are seen. Extensive lymphadenopathy is identified in the periaortic region of the upper abdomen as well as in the intra-aortocaval region. Soft tissue masses are noted adjacent to the sigmoid colon best seen on image number 73 of series 2 stable from the prior exam. Reproductive: Uterus appears within normal limits. Other: No free fluid is noted.  No herniation is seen. Musculoskeletal: No acute bony abnormality is noted. IMPRESSION: Extensive changes consistent with the  known history of metastatic disease. These changes are stable in the interval from 2 days previous. This is likely of colonic origin given the changes in the rectosigmoid and rectum. Biopsy results from the liver are pending. Mild left biliary ductal dilatation related to the underlying hepatic metastatic disease. This is stable from the prior exam. No significant ascites. No findings to suggest post biopsy hemorrhage or other complication. Electronically Signed   By: Oneil Devonshire M.D.   On: 02/21/2024 00:31   US  BIOPSY (LIVER) Result Date: 02/19/2024 INDICATION: Masses of liver and lymphadenopathy and pulmonary nodules in the chest. The patient presents for liver biopsy.  EXAM: ULTRASOUND GUIDED CORE BIOPSY OF LIVER MEDICATIONS: None. ANESTHESIA/SEDATION: Moderate (conscious) sedation was employed during this procedure. A total of Versed 1.0 mg and Fentanyl 50 mcg was administered intravenously. Moderate Sedation Time: 13 minutes. The patient's level of consciousness and vital signs were monitored continuously by radiology nursing throughout the procedure under my direct supervision. PROCEDURE: The procedure, risks, benefits, and alternatives were explained to the patient. Questions regarding the procedure were encouraged and answered. The patient understands and consents to the procedure. A time out was performed prior to initiating the procedure. The abdominal wall was prepped with chlorhexidine in a sterile fashion, and a sterile drape was applied covering the operative field. A sterile gown and sterile gloves were used for the procedure. Local anesthesia was provided with 1% Lidocaine. Ultrasound was performed of the liver to localize lesions. A site was chosen over the anterior abdominal wall for biopsy. Under ultrasound guidance, a 17 gauge trocar needle was advanced into the left lobe of the liver. After confirming needle tip position, 3 separate coaxial 18 gauge core biopsy samples were obtained of a left lobe liver lesion. Samples were submitted in formalin. A slurry of Gel-Foam EmboCubes mixed with sterile saline was then injected as the outer needle was retracted and removed. Additional ultrasound was performed COMPLICATIONS: None immediate. FINDINGS: Large ill-defined hyperechoic mass in the left lobe of the liver measuring roughly 12 cm and smaller right lobe mass were localized by ultrasound. The left lobe lesion was chosen for biopsy. Solid tissue was obtained. IMPRESSION: Ultrasound-guided core biopsy performed of a large mass in the left lobe of the liver measuring up to roughly 12 cm. Electronically Signed   By: Marcey Moan M.D.   On: 02/19/2024 13:09     PERFORMANCE STATUS (ECOG) : 1 - Symptomatic but completely ambulatory  Review of Systems Unless otherwise noted, a complete review of systems is negative.  Physical Exam General: NAD Pulmonary: Unlabored Extremities: no edema, no joint deformities Skin: no rashes Neurological: Weakness but otherwise nonfocal  IMPRESSION: Met with patient in infusion.  She is accompanied by her youngest daughter.  Patient recently diagnosed with stage IV colon cancer with liver metastasis.  She has been started on FOLFOX.  She says she tolerated cycle 1 well without severe adverse effects or toxicities.  However, patient does endorse some numbness and tingling to her feet, which is keeping her awake at night.  Will trial low-dose gabapentin at bedtime.  Patient says that she lives at home with her husband and is functionally independent with her own care.  She describes good social support with 3 daughters.  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  Patient verbalizes agreement with current scope of treatment.  She is interested in establishing ACP documents.  Will refer to social work  ACP clinic.  PLAN: - Continue current scope of treatment - Start gabapentin 100 mg nightly - Referral to social work - Follow-up telephone visit 1 month   Patient expressed understanding and was in agreement with this plan. She also understands that She can call the clinic at any time with any questions, concerns, or complaints.     Time Total: 20 minutes  Visit consisted of counseling and education dealing with the complex and emotionally intense issues of symptom management and palliative care in the setting of serious and potentially life-threatening illness.Greater than 50%  of this time was spent counseling and coordinating care related to the above assessment and plan.  Signed by: Fonda Mower, PhD, NP-C

## 2024-03-20 NOTE — Telephone Encounter (Signed)
 Clinical Social Work received referral from [employer] to assess psychosocial needs.  Left a voicemail with contact information and a request for a call back.

## 2024-03-20 NOTE — Progress Notes (Signed)
 Patient requesting a refill for fentanyl patches & would like more information regarding eating at buffets. She is also experiencing tingling in both feet and would like a medication for hemorrhoids.

## 2024-03-21 ENCOUNTER — Encounter: Payer: Self-pay | Admitting: Oncology

## 2024-03-21 ENCOUNTER — Telehealth: Payer: Self-pay

## 2024-03-21 ENCOUNTER — Other Ambulatory Visit: Payer: Self-pay

## 2024-03-21 LAB — CEA: CEA: 471 ng/mL — ABNORMAL HIGH (ref 0.0–4.7)

## 2024-03-21 NOTE — Telephone Encounter (Signed)
 Fax received for FMLA form and [employer] short term disability form.

## 2024-03-21 NOTE — Telephone Encounter (Signed)
 Outbound call to [facility] in [place] to cancel Fentanyl patch script sent 03/20/24, medication is not currently in stock and has been sent to an alternate pharmacy instead.  Spoke to Winifred at pharmacy and confirmed order has been cancelled.

## 2024-03-22 ENCOUNTER — Encounter: Payer: Self-pay | Admitting: Oncology

## 2024-03-22 ENCOUNTER — Inpatient Hospital Stay

## 2024-03-22 ENCOUNTER — Telehealth: Payer: Self-pay

## 2024-03-22 MED ORDER — FENTANYL 12 MCG/HR TD PT72: 1.0000 | MEDICATED_PATCH | TRANSDERMAL | 0 refills | Status: DC

## 2024-03-22 NOTE — Telephone Encounter (Signed)
 Asked to look into cancellation for [facility]ill [facility] [address]

## 2024-03-25 ENCOUNTER — Encounter: Payer: Self-pay | Admitting: Oncology

## 2024-03-25 NOTE — Telephone Encounter (Signed)
 Call placed to patient to confirm that she is seeking continuous FMLA and Short term disability.

## 2024-03-25 NOTE — Telephone Encounter (Signed)
Completed paperwork faxed 

## 2024-04-02 ENCOUNTER — Inpatient Hospital Stay

## 2024-04-02 ENCOUNTER — Encounter: Payer: Self-pay | Admitting: Oncology

## 2024-04-02 ENCOUNTER — Inpatient Hospital Stay: Admitting: Oncology

## 2024-04-03 ENCOUNTER — Other Ambulatory Visit: Payer: Self-pay

## 2024-04-03 ENCOUNTER — Inpatient Hospital Stay: Attending: Oncology

## 2024-04-03 ENCOUNTER — Encounter: Payer: Self-pay | Admitting: Oncology

## 2024-04-03 ENCOUNTER — Inpatient Hospital Stay: Admitting: Oncology

## 2024-04-03 ENCOUNTER — Inpatient Hospital Stay

## 2024-04-03 VITALS — BP 123/66 | HR 100 | Temp 98.8°F | Resp 19 | Ht 70.0 in | Wt 315.0 lb

## 2024-04-03 VITALS — BP 138/73 | HR 85

## 2024-04-03 DIAGNOSIS — Z79899 Other long term (current) drug therapy: Secondary | ICD-10-CM | POA: Insufficient documentation

## 2024-04-03 DIAGNOSIS — C19 Malignant neoplasm of rectosigmoid junction: Secondary | ICD-10-CM | POA: Diagnosis not present

## 2024-04-03 DIAGNOSIS — Z5111 Encounter for antineoplastic chemotherapy: Secondary | ICD-10-CM | POA: Diagnosis present

## 2024-04-03 DIAGNOSIS — Z87891 Personal history of nicotine dependence: Secondary | ICD-10-CM | POA: Insufficient documentation

## 2024-04-03 DIAGNOSIS — Z5112 Encounter for antineoplastic immunotherapy: Secondary | ICD-10-CM

## 2024-04-03 DIAGNOSIS — C787 Secondary malignant neoplasm of liver and intrahepatic bile duct: Secondary | ICD-10-CM | POA: Diagnosis not present

## 2024-04-03 DIAGNOSIS — C7972 Secondary malignant neoplasm of left adrenal gland: Secondary | ICD-10-CM | POA: Diagnosis present

## 2024-04-03 LAB — CMP (CANCER CENTER ONLY)
ALT: 24 U/L (ref 0–44)
AST: 51 U/L — ABNORMAL HIGH (ref 15–41)
Albumin: 3.8 g/dL (ref 3.5–5.0)
Alkaline Phosphatase: 125 U/L (ref 38–126)
Anion gap: 10 (ref 5–15)
BUN: 15 mg/dL (ref 6–20)
CO2: 27 mmol/L (ref 22–32)
Calcium: 9.8 mg/dL (ref 8.9–10.3)
Chloride: 101 mmol/L (ref 98–111)
Creatinine: 0.65 mg/dL (ref 0.44–1.00)
GFR, Estimated: >60 mL/min (ref >60)
Glucose, Bld: 146 mg/dL — ABNORMAL HIGH (ref 70–99)
Potassium: 3.7 mmol/L (ref 3.5–5.1)
Sodium: 138 mmol/L (ref 135–145)
Total Bilirubin: 0.6 mg/dL (ref 0.0–1.2)
Total Protein: 7.3 g/dL (ref 6.5–8.1)

## 2024-04-03 LAB — CBC WITH DIFFERENTIAL (CANCER CENTER ONLY)
Abs Immature Granulocytes: 0.02 K/uL (ref 0.00–0.07)
Basophils Absolute: 0 K/uL (ref 0.0–0.1)
Basophils Relative: 1 %
Eosinophils Absolute: 0.2 K/uL (ref 0.0–0.5)
Eosinophils Relative: 3 %
HCT: 41.6 % (ref 36.0–46.0)
Hemoglobin: 13.1 g/dL (ref 12.0–15.0)
Immature Granulocytes: 0 %
Lymphocytes Relative: 31 %
Lymphs Abs: 1.8 K/uL (ref 0.7–4.0)
MCH: 25.4 pg — ABNORMAL LOW (ref 26.0–34.0)
MCHC: 31.5 g/dL (ref 30.0–36.0)
MCV: 80.8 fL (ref 80.0–100.0)
Monocytes Absolute: 0.6 K/uL (ref 0.1–1.0)
Monocytes Relative: 10 %
Neutro Abs: 3.3 K/uL (ref 1.7–7.7)
Neutrophils Relative %: 55 %
Platelet Count: 214 K/uL (ref 150–400)
RBC: 5.15 MIL/uL — ABNORMAL HIGH (ref 3.87–5.11)
RDW: 18.6 % — ABNORMAL HIGH (ref 11.5–15.5)
WBC Count: 5.9 K/uL (ref 4.0–10.5)
nRBC: 0 % (ref 0.0–0.2)

## 2024-04-03 LAB — MAGNESIUM: Magnesium: 2.1 mg/dL (ref 1.7–2.4)

## 2024-04-03 MED ORDER — OXALIPLATIN CHEMO INJECTION 100 MG/20ML
65.0000 mg/m2 | Freq: Once | INTRAVENOUS | Status: AC
  Administered 2024-04-03: 170 mg via INTRAVENOUS
  Filled 2024-04-03: qty 34

## 2024-04-03 MED ORDER — GABAPENTIN 300 MG PO CAPS
300.0000 mg | ORAL_CAPSULE | Freq: Three times a day (TID) | ORAL | 1 refills | Status: AC
  Filled 2024-04-03: qty 90, 30d supply, fill #0
  Filled 2024-05-04: qty 90, 30d supply, fill #1

## 2024-04-03 MED ORDER — DEXTROSE 5 % IV SOLN
INTRAVENOUS | Status: DC
  Filled 2024-04-03: qty 250

## 2024-04-03 MED ORDER — DEXAMETHASONE SOD PHOSPHATE PF 10 MG/ML IJ SOLN
10.0000 mg | Freq: Once | INTRAMUSCULAR | Status: AC
  Administered 2024-04-03: 10 mg via INTRAVENOUS

## 2024-04-03 MED ORDER — SODIUM CHLORIDE 0.9 % IV SOLN
2400.0000 mg/m2 | INTRAVENOUS | Status: DC
  Filled 2024-04-03: qty 140

## 2024-04-03 MED ORDER — FLUOROURACIL CHEMO INJECTION 2.5 GM/50ML
400.0000 mg/m2 | Freq: Once | INTRAVENOUS | Status: AC
  Administered 2024-04-03: 1000 mg via INTRAVENOUS
  Filled 2024-04-03: qty 20

## 2024-04-03 MED ORDER — SODIUM CHLORIDE 0.9 % IV SOLN
2400.0000 mg/m2 | INTRAVENOUS | Status: DC
  Administered 2024-04-03: 7000 mg via INTRAVENOUS
  Filled 2024-04-03 (×5): qty 140

## 2024-04-03 MED ORDER — SODIUM CHLORIDE 0.9 % IV SOLN
6.0000 mg/kg | Freq: Once | INTRAVENOUS | Status: AC
  Administered 2024-04-03: 800 mg via INTRAVENOUS
  Filled 2024-04-03: qty 40

## 2024-04-03 MED ORDER — DOXYCYCLINE HYCLATE 100 MG PO TABS
100.0000 mg | ORAL_TABLET | Freq: Two times a day (BID) | ORAL | 1 refills | Status: DC
  Filled 2024-04-03: qty 60, 30d supply, fill #0
  Filled 2024-05-04: qty 60, 30d supply, fill #1

## 2024-04-03 MED ORDER — LEUCOVORIN CALCIUM INJECTION 350 MG
400.0000 mg/m2 | Freq: Once | INTRAVENOUS | Status: AC
  Administered 2024-04-03: 1056 mg via INTRAVENOUS
  Filled 2024-04-03: qty 50

## 2024-04-03 MED ORDER — PALONOSETRON HCL INJECTION 0.25 MG/5ML
0.2500 mg | Freq: Once | INTRAVENOUS | Status: AC
  Administered 2024-04-03: 0.25 mg via INTRAVENOUS
  Filled 2024-04-03: qty 5

## 2024-04-03 MED ORDER — HYDROCORTISONE 1 % EX CREA
1.0000 | TOPICAL_CREAM | Freq: Two times a day (BID) | CUTANEOUS | 1 refills | Status: AC
  Filled 2024-04-03 (×2): qty 28, 9d supply, fill #0
  Filled 2024-05-29: qty 28, 9d supply, fill #1

## 2024-04-03 MED ORDER — HYDROMORPHONE HCL 2 MG PO TABS: 2.0000 mg | ORAL_TABLET | ORAL | 0 refills | Status: AC

## 2024-04-03 MED ORDER — SODIUM CHLORIDE 0.9 % IV SOLN
INTRAVENOUS | Status: DC
  Filled 2024-04-03: qty 250

## 2024-04-03 NOTE — Progress Notes (Signed)
 Outbound call to [facility] pharmacy to make sure hydrocortisone lotion 1% and doxy, spoke to rebecca who indicated . Said hydrocortisone cream 1% is at their central location, indicated okay to switch to cream as opposed to lotion and they'll have it sent to their location.  Doxy is in stock.

## 2024-04-03 NOTE — Progress Notes (Signed)
 Patient states her neuropathy in her feet is worsening.

## 2024-04-03 NOTE — Progress Notes (Signed)
 Hematology/Oncology Consult note [facility]  [phone] [phone]  Patient Care Team: Patient, No Pcp Per as PCP - General (General Practice) Maurie Rayfield BIRCH, RN as Oncology Nurse Navigator Melanee Annah BROCKS, MD as Consulting Physician (Oncology)   Name of the patient: Melissa Johns  [id number]  1965-06-22   Date of visit: 04/03/24  Diagnosis-  Cancer Staging  Colorectal cancer, stage IV [facility]) Staging form: Colon and Rectum, AJCC 8th Edition - Clinical stage from 02/23/2024: Stage IVC (cTX, cN2b, pM1c) - Signed by Melanee Annah BROCKS, MD on 02/25/2024    Chief complaint/ Reason for visit-on treatment assessment prior to cycle 3 of palliative FOLFOX chemotherapy and start of panitumumab  Heme/Onc history:  patient is a 58 year old female with a past medical history significant for hypertension who presented with symptoms of epigastric and right upper quadrant abdominal pain which has been ongoing for 2 weeks.She underwent CT abdomen pelvis with contrast in the ER which showed significant thoracic adenopathy, multiple hepatic masses.  Left greater than right adrenal nodules.  There was evidence of intra-abdominal adenopathy including index gastrohepatic lymph node measuring 2.2 cm.  Extensive adenopathy within the sigmoid mesocolon with a nodal mass of 3.8 x 4.2 cm.  Findings concerning for rectosigmoid primary given the eccentric right wall thickening.   CT angio chest did not show any evidence of large central pulmonary embolus or subsegmental pulmonary embolism.  Bulky mediastinal and bilateral hilar adenopathy. Ultrasound-guided liver biopsy showed metastatic moderately differentiated adenocarcinoma consistent with colorectal primary.  Focal CK7 positive bile ducts.  Cells positive for CK20 and CDX2 and negative for CK7 GATA3 PAX8 and TTF-1.  Tempus testing on the specimen showed negative for KRAS and NRAS.  APC, T p53 and SMAD4 mutation.  DYPD  normal.  MSI stable.  HER2 equivocal by IHC and negative by FISH.   Patient was started on palliative FOLFOX chemotherapy on 03/01/2024.  Plan to start panitumumab with cycle 3  Interval history- Discussed the use of AI scribe software for clinical note transcription with the patient, who gave verbal consent to proceed.  History of Present Illness   Melissa Johns is a 58 year old female who presents with worsening neuropathy symptoms at night.  She experiences worsening neuropathy symptoms, particularly at night, which significantly impact her ability to sleep. The symptoms are less bothersome during the day when she is active. Despite being on her third treatment with oxaliplatin , the neuropathy persists.  She uses a fentanyl  patch and takes Dilaudid , one in the morning and one at night, to manage her pain. However, the neuropathy still bothers her at night, sometimes making her feel like she will never fall asleep, although she eventually does.  She has been prescribed gabapentin , starting at 100 mg at night.  She is also starting treatment with panitumumab, which is known to cause skin rashes.       ECOG PS- 1 Pain scale- 0 Opioid associated constipation- no  Review of systems- Review of Systems  Constitutional:  Positive for malaise/fatigue.  Neurological:  Positive for sensory change (Peripheral neuropathy).      Allergies  Allergen Reactions   Penicillins Other (See Comments)    joints lock up  Joints lock becomes immobile, joint pain     Past Medical History:  Diagnosis Date   Ectopic pregnancy    Hypertension    Miscarriage      Past Surgical History:  Procedure Laterality Date   CYST REMOVAL HAND  Right    IR IMAGING GUIDED PORT INSERTION  02/27/2024   LIVER BIOPSY     SALPINGECTOMY     Etopic Pregnancy    Social History   Socioeconomic History   Marital status: Married    Spouse name: Ozell   Number of children: 3   Years of education: Not  on file   Highest education level: Not on file  Occupational History   Not on file  Tobacco Use   Smoking status: Former    Types: Cigarettes   Smokeless tobacco: Never  Vaping Use   Vaping status: Former   Quit date: 02/13/2024  Substance and Sexual Activity   Alcohol use: No   Drug use: No   Sexual activity: Yes  Other Topics Concern   Not on file  Social History Narrative   Not on file   Social Drivers of Health   Financial Resource Strain: Not on file  Food Insecurity: No Food Insecurity (02/23/2024)   Hunger Vital Sign    Worried About Running Out of Food in the Last Year: Never true    Ran Out of Food in the Last Year: Never true  Transportation Needs: No Transportation Needs (02/23/2024)   PRAPARE - Administrator, Civil Service (Medical): No    Lack of Transportation (Non-Medical): No  Physical Activity: Not on file  Stress: Not on file  Social Connections: Not on file  Intimate Partner Violence: Not At Risk (02/23/2024)   Humiliation, Afraid, Rape, and Kick questionnaire    Fear of Current or Ex-Partner: No    Emotionally Abused: No    Physically Abused: No    Sexually Abused: No    Family History  Problem Relation Age of Onset   Aneurysm Mother    Hypertension Mother    Heart Problems Father    Breast cancer Sister 84   COPD Half-Sister      Current Outpatient Medications:    fentaNYL  (DURAGESIC ) 12 MCG/HR, Place 1 patch onto the skin every 3 (three) days., Disp: 10 patch, Rfl: 0   gabapentin  (NEURONTIN ) 100 MG capsule, Take 1 capsule (100 mg total) by mouth at bedtime., Disp: 30 capsule, Rfl: 0   HYDROcodone  bit-homatropine (HYCODAN) 5-1.5 MG/5ML syrup, Take 5 mLs by mouth every 6 (six) hours as needed for cough., Disp: 120 mL, Rfl: 0   HYDROmorphone  (DILAUDID ) 2 MG tablet, Take 1 tablet (2 mg total) by mouth every 3 (three) hours., Disp: 90 tablet, Rfl: 0   lisinopril -hydrochlorothiazide  (ZESTORETIC ) 20-12.5 MG tablet, Take 1 tablet by  mouth daily., Disp: 30 tablet, Rfl: 2   polyethylene glycol (MIRALAX ) 17 g packet, Take 17 g by mouth 2 (two) times daily., Disp: 180 packet, Rfl: 0  Physical exam: There were no vitals filed for this visit. Physical Exam Cardiovascular:     Rate and Rhythm: Normal rate and regular rhythm.     Heart sounds: Normal heart sounds.  Pulmonary:     Effort: Pulmonary effort is normal.     Breath sounds: Normal breath sounds.  Skin:    General: Skin is warm and dry.  Neurological:     Mental Status: She is alert and oriented to person, place, and time.      I have personally reviewed labs listed below:    Latest Ref Rng & Units 03/20/2024    8:33 AM  CMP  Glucose 70 - 99 mg/dL 858   BUN 6 - 20 mg/dL 17   Creatinine 9.55 -  1.00 mg/dL 9.26   Sodium 864 - 854 mmol/L 134   Potassium 3.5 - 5.1 mmol/L 3.8   Chloride 98 - 111 mmol/L 97   CO2 22 - 32 mmol/L 27   Calcium  8.9 - 10.3 mg/dL 9.2   Total Protein 6.5 - 8.1 g/dL 7.3   Total Bilirubin 0.0 - 1.2 mg/dL 0.5   Alkaline Phos 38 - 126 U/L 103   AST 15 - 41 U/L 42   ALT 0 - 44 U/L 21       Latest Ref Rng & Units 04/03/2024   10:06 AM  CBC  WBC 4.0 - 10.5 K/uL 5.9   Hemoglobin 12.0 - 15.0 g/dL 86.8   Hematocrit 63.9 - 46.0 % 41.6   Platelets 150 - 400 K/uL 214     Assessment and plan- Patient is a 58 y.o. female with history of metastatic with history of metastatic colorectal adenocarcinoma with likely origin in the rectum here for on treatment assessment prior to cycle 3 of palliative FOLFOX chemotherapy and start of panitumumab   Assessment and Plan    Stage IV colorectal cancer Undergoing FOLFOX chemotherapy. Neuropathy from oxaliplatin  noted. Monitoring CEA levels for expected decrease. Potential switch to irinotecan and decitabine if neuropathy intolerable. FOLFIRI considered if progression occurs. - Continue FOLFOX regimen with oxaliplatin . - Monitor CEA levels. - Reassess treatment plan in two weeks based on neuropathy  and CEA levels. - Consider switching to irinotecan instead of oxaliplatin  if neuropathy becomes intolerable.  Chemotherapy-induced peripheral neuropathy Neuropathy worsening, especially at night. Gabapentin  dose can be increased. Opioids typically not highly effective for neuropathic pain. - Increase gabapentin  to 300 mg at night, then to 300 mg three times a day. - Use Dilaudid  for acute exacerbations of neuropathy. - Reassess neuropathy in two weeks.  Anticipated panitumumab -induced skin rash Panitumumab  added, known to cause skin rash. Preventive measures include doxycycline  and steroid cream. Sun exposure precautions advised. - Prescribed doxycycline  100 mg twice a day. - Prescribed steroid cream for skin rash. - Advised wearing sunscreen to prevent skin irritation.         Visit Diagnosis 1. Colorectal cancer, stage IV (HCC)   2. Encounter for antineoplastic chemotherapy   3. Encounter for monoclonal antibody treatment for malignancy      Dr. Annah Skene, MD, MPH [facility] [phone] 04/03/2024 10:33 AM

## 2024-04-04 ENCOUNTER — Inpatient Hospital Stay

## 2024-04-04 LAB — CEA: CEA: 552 ng/mL — ABNORMAL HIGH (ref 0.0–4.7)

## 2024-04-05 ENCOUNTER — Inpatient Hospital Stay: Payer: Self-pay

## 2024-04-05 ENCOUNTER — Inpatient Hospital Stay

## 2024-04-05 VITALS — BP 141/87 | HR 95 | Temp 99.0°F | Resp 18

## 2024-04-05 DIAGNOSIS — C19 Malignant neoplasm of rectosigmoid junction: Secondary | ICD-10-CM

## 2024-04-05 NOTE — Progress Notes (Signed)
 Pt reports last vision exam was late September/ early October and starting a few weeks ago vision has been intermittently fuzzy. Pt also reports ankle joint pain that could be from my arthritis.  Dr. Melanee aware, per Dr. Melanee monitor at this time. Pt aware to inform clinic of persist or worsening vision changes, pt verbalies understanding. No other symptoms at this time. Pt stable at this time.

## 2024-04-11 ENCOUNTER — Encounter: Payer: Self-pay | Admitting: Oncology

## 2024-04-11 ENCOUNTER — Other Ambulatory Visit: Payer: Self-pay

## 2024-04-11 MED ORDER — TRAZODONE HCL 50 MG PO TABS
50.0000 mg | ORAL_TABLET | Freq: Every evening | ORAL | 0 refills | Status: DC | PRN
  Filled 2024-04-11: qty 30, 30d supply, fill #0

## 2024-04-11 MED ORDER — FENTANYL 12 MCG/HR TD PT72
1.0000 | MEDICATED_PATCH | TRANSDERMAL | 0 refills | Status: AC
  Filled 2024-04-11 – 2024-04-17 (×4): qty 10, 30d supply, fill #0
  Filled ????-??-??: fill #0

## 2024-04-11 NOTE — Telephone Encounter (Signed)
 Dr. Melanee, please advise on sleep med.    Refill request for Fentanyl  entered and sent to MD.

## 2024-04-11 NOTE — Telephone Encounter (Signed)
 Trazodone 50 mg at bedtime 30 tab no refill

## 2024-04-13 ENCOUNTER — Other Ambulatory Visit: Payer: Self-pay

## 2024-04-17 ENCOUNTER — Inpatient Hospital Stay

## 2024-04-17 ENCOUNTER — Inpatient Hospital Stay: Admitting: Oncology

## 2024-04-17 ENCOUNTER — Encounter: Payer: Self-pay | Admitting: Oncology

## 2024-04-17 ENCOUNTER — Other Ambulatory Visit: Payer: Self-pay

## 2024-04-17 VITALS — BP 119/64 | HR 99 | Temp 98.0°F | Resp 20 | Ht 70.0 in | Wt 320.1 lb

## 2024-04-17 VITALS — BP 168/88 | HR 85

## 2024-04-17 DIAGNOSIS — Z5111 Encounter for antineoplastic chemotherapy: Secondary | ICD-10-CM | POA: Diagnosis not present

## 2024-04-17 DIAGNOSIS — Z5112 Encounter for antineoplastic immunotherapy: Secondary | ICD-10-CM

## 2024-04-17 DIAGNOSIS — C19 Malignant neoplasm of rectosigmoid junction: Secondary | ICD-10-CM

## 2024-04-17 DIAGNOSIS — G893 Neoplasm related pain (acute) (chronic): Secondary | ICD-10-CM

## 2024-04-17 LAB — CBC WITH DIFFERENTIAL (CANCER CENTER ONLY)
Abs Immature Granulocytes: 0.02 K/uL (ref 0.00–0.07)
Basophils Absolute: 0 K/uL (ref 0.0–0.1)
Basophils Relative: 1 %
Eosinophils Absolute: 0.1 K/uL (ref 0.0–0.5)
Eosinophils Relative: 2 %
HCT: 42.6 % (ref 36.0–46.0)
Hemoglobin: 13.6 g/dL (ref 12.0–15.0)
Immature Granulocytes: 0 %
Lymphocytes Relative: 33 %
Lymphs Abs: 1.7 K/uL (ref 0.7–4.0)
MCH: 26.6 pg (ref 26.0–34.0)
MCHC: 31.9 g/dL (ref 30.0–36.0)
MCV: 83.4 fL (ref 80.0–100.0)
Monocytes Absolute: 0.5 K/uL (ref 0.1–1.0)
Monocytes Relative: 11 %
Neutro Abs: 2.7 K/uL (ref 1.7–7.7)
Neutrophils Relative %: 53 %
Platelet Count: 155 K/uL (ref 150–400)
RBC: 5.11 MIL/uL (ref 3.87–5.11)
RDW: 20.1 % — ABNORMAL HIGH (ref 11.5–15.5)
WBC Count: 5.1 K/uL (ref 4.0–10.5)
nRBC: 0 % (ref 0.0–0.2)

## 2024-04-17 LAB — CMP (CANCER CENTER ONLY)
ALT: 21 U/L (ref 0–44)
AST: 38 U/L (ref 15–41)
Albumin: 3.8 g/dL (ref 3.5–5.0)
Alkaline Phosphatase: 111 U/L (ref 38–126)
Anion gap: 10 (ref 5–15)
BUN: 14 mg/dL (ref 6–20)
CO2: 27 mmol/L (ref 22–32)
Calcium: 9.6 mg/dL (ref 8.9–10.3)
Chloride: 100 mmol/L (ref 98–111)
Creatinine: 0.66 mg/dL (ref 0.44–1.00)
GFR, Estimated: >60 mL/min (ref >60)
Glucose, Bld: 167 mg/dL — ABNORMAL HIGH (ref 70–99)
Potassium: 4.1 mmol/L (ref 3.5–5.1)
Sodium: 137 mmol/L (ref 135–145)
Total Bilirubin: 0.5 mg/dL (ref 0.0–1.2)
Total Protein: 7.1 g/dL (ref 6.5–8.1)

## 2024-04-17 LAB — MAGNESIUM: Magnesium: 2.1 mg/dL (ref 1.7–2.4)

## 2024-04-17 MED ORDER — FLUOROURACIL CHEMO INJECTION 2.5 GM/50ML
400.0000 mg/m2 | Freq: Once | INTRAVENOUS | Status: AC
  Administered 2024-04-17: 15:00:00 1000 mg via INTRAVENOUS
  Filled 2024-04-17: qty 20

## 2024-04-17 MED ORDER — SODIUM CHLORIDE 0.9 % IV SOLN
INTRAVENOUS | Status: DC
  Filled 2024-04-17: qty 250

## 2024-04-17 MED ORDER — SODIUM CHLORIDE 0.9 % IV SOLN
2400.0000 mg/m2 | INTRAVENOUS | Status: DC
  Administered 2024-04-17: 15:00:00 7000 mg via INTRAVENOUS
  Filled 2024-04-17: qty 140

## 2024-04-17 MED ORDER — OXALIPLATIN CHEMO INJECTION 100 MG/20ML
65.0000 mg/m2 | Freq: Once | INTRAVENOUS | Status: AC
  Administered 2024-04-17: 13:00:00 170 mg via INTRAVENOUS
  Filled 2024-04-17: qty 34

## 2024-04-17 MED ORDER — LEUCOVORIN CALCIUM INJECTION 350 MG
400.0000 mg/m2 | Freq: Once | INTRAVENOUS | Status: AC
  Administered 2024-04-17: 13:00:00 1056 mg via INTRAVENOUS
  Filled 2024-04-17: qty 50

## 2024-04-17 MED ORDER — SODIUM CHLORIDE 0.9 % IV SOLN
6.0000 mg/kg | Freq: Once | INTRAVENOUS | Status: AC
  Administered 2024-04-17: 12:00:00 800 mg via INTRAVENOUS
  Filled 2024-04-17: qty 40

## 2024-04-17 MED ORDER — PALONOSETRON HCL INJECTION 0.25 MG/5ML
0.2500 mg | Freq: Once | INTRAVENOUS | Status: AC
  Administered 2024-04-17: 11:00:00 0.25 mg via INTRAVENOUS
  Filled 2024-04-17: qty 5

## 2024-04-17 MED ORDER — DEXAMETHASONE SOD PHOSPHATE PF 10 MG/ML IJ SOLN
10.0000 mg | Freq: Once | INTRAMUSCULAR | Status: AC
  Administered 2024-04-17: 11:00:00 10 mg via INTRAVENOUS

## 2024-04-17 MED ORDER — DEXTROSE 5 % IV SOLN
INTRAVENOUS | Status: DC
  Filled 2024-04-17: qty 250

## 2024-04-17 NOTE — Patient Instructions (Signed)
 [facility] - A DEPT OF [name]  Discharge Instructions: Thank you for choosing [facility] to provide your oncology and hematology care.  If you have a lab appointment with the Cancer Center, please go directly to the Cancer Center and check in at the registration area.  Wear comfortable clothing and clothing appropriate for easy access to any Portacath or PICC line.   We strive to give you quality time with your provider. You may need to reschedule your appointment if you arrive late (15 or more minutes).  Arriving late affects you and other patients whose appointments are after yours.  Also, if you miss three or more appointments without notifying the office, you may be dismissed from the clinic at the providers discretion.      For prescription refill requests, have your pharmacy contact our office and allow 72 hours for refills to be completed.    Today you received the following chemotherapy and/or immunotherapy agents- vectibix , oxaliplatin , leucovorin , 5FU      To help prevent nausea and vomiting after your treatment, we encourage you to take your nausea medication as directed.  BELOW ARE SYMPTOMS THAT SHOULD BE REPORTED IMMEDIATELY: *FEVER GREATER THAN 100.4 F (38 C) OR HIGHER *CHILLS OR SWEATING *NAUSEA AND VOMITING THAT IS NOT CONTROLLED WITH YOUR NAUSEA MEDICATION *UNUSUAL SHORTNESS OF BREATH *UNUSUAL BRUISING OR BLEEDING *URINARY PROBLEMS (pain or burning when urinating, or frequent urination) *BOWEL PROBLEMS (unusual diarrhea, constipation, pain near the anus) TENDERNESS IN MOUTH AND THROAT WITH OR WITHOUT PRESENCE OF ULCERS (sore throat, sores in mouth, or a toothache) UNUSUAL RASH, SWELLING OR PAIN  UNUSUAL VAGINAL DISCHARGE OR ITCHING   Items with * indicate a potential emergency and should be followed up as soon as possible or go to the Emergency Department if any problems should occur.  Please show the CHEMOTHERAPY  ALERT CARD or IMMUNOTHERAPY ALERT CARD at check-in to the Emergency Department and triage nurse.  Should you have questions after your visit or need to cancel or reschedule your appointment, please contact [facility] - A DEPT OF [name]  [phone] and follow the prompts.  Office hours are 8:00 a.m. to 4:30 p.m. Monday - Friday. Please note that voicemails left after 4:00 p.m. may not be returned until the following business day.  We are closed weekends and major holidays. You have access to a nurse at all times for urgent questions. Please call the main number to the clinic [phone] and follow the prompts.  For any non-urgent questions, you may also contact your provider using MyChart. We now offer e-Visits for anyone 30 and older to request care online for non-urgent symptoms. For details visit [email or web address].   Also download the MyChart app! Go to the app store, search MyChart, open the app, select [place], and log in with your MyChart username and password.

## 2024-04-17 NOTE — Progress Notes (Signed)
 Patient states left ankle has been hurting really bad; feeling stiff.   Patient also states the medication given for sleep isn't working. She is also having some head congestion & nose bleeds.

## 2024-04-17 NOTE — Progress Notes (Signed)
 Hematology/Oncology Consult note [facility]  [phone] [phone]  Patient Care Team: Patient, No Pcp Per as PCP - General (General Practice) Maurie Rayfield BIRCH, RN as Oncology Nurse Navigator Melanee Annah BROCKS, MD as Consulting Physician (Oncology)   Name of the patient: Melissa Johns  [id number]  28-Apr-1966   Date of visit: 04/17/2024  Diagnosis-  Cancer Staging  Colorectal cancer, stage IV [facility]) Staging form: Colon and Rectum, AJCC 8th Edition - Clinical stage from 02/23/2024: Stage IVC (cTX, cN2b, pM1c) - Signed by Melanee Annah BROCKS, MD on 02/25/2024    Chief complaint/ Reason for visit-on treatment assessment prior to cycle 4 of palliative FOLFOX chemotherapy along with panitumumab   Heme/Onc history:  patient is a 58 year old female with a past medical history significant for hypertension who presented with symptoms of epigastric and right upper quadrant abdominal pain which has been ongoing for 2 weeks.She underwent CT abdomen pelvis with contrast in the ER which showed significant thoracic adenopathy, multiple hepatic masses.  Left greater than right adrenal nodules.  There was evidence of intra-abdominal adenopathy including index gastrohepatic lymph node measuring 2.2 cm.  Extensive adenopathy within the sigmoid mesocolon with a nodal mass of 3.8 x 4.2 cm.  Findings concerning for rectosigmoid primary given the eccentric right wall thickening.   CT angio chest did not show any evidence of large central pulmonary embolus or subsegmental pulmonary embolism.  Bulky mediastinal and bilateral hilar adenopathy. Ultrasound-guided liver biopsy showed metastatic moderately differentiated adenocarcinoma consistent with colorectal primary.  Focal CK7 positive bile ducts.  Cells positive for CK20 and CDX2 and negative for CK7 GATA3 PAX8 and TTF-1.  Tempus testing on the specimen showed negative for KRAS and NRAS.  APC, T p53 and SMAD4 mutation.  DYPD  normal.  MSI stable.  HER2 equivocal by IHC and negative by FISH.   Patient was started on palliative FOLFOX chemotherapy on 03/01/2024.  Plan to start panitumumab  with cycle 3   Interval history- Discussed the use of AI scribe software for clinical note transcription with the patient, who gave verbal consent to proceed.  History of Present Illness   Melissa Johns is a 58 year old female with stage IV metastatic colorectal cancer who presents for ongoing chemotherapy management and assessment of treatment-related toxicities.  She is currently receiving her fourth cycle of FOLFOX and panitumumab . She is aware of a rising CEA level and expresses concern.  She has not developed a skin rash from panitumumab , though she notes mild perioral irritation, which she attributes to lotion use and cold exposure. She continues doxycycline  as prescribed and has not required topical therapy.  She experiences mild exertional dyspnea, such as with moving or lifting objects, but otherwise her respiratory status is stable. She denies significant pain and maintains pain control with a fentanyl  patch and Dilaudid , without need for additional breakthrough medication.  Chemotherapy-induced peripheral neuropathy persists but is improved with gabapentin  300 mg twice daily. She discontinued a third daily dose after improvement in ambulation and sensation. The oxaliplatin  dose has been reduced.  She describes left ankle pain and limited range of motion, which improved after a 'pop' in the joint. She is uncertain if this is related to chemotherapy or underlying arthritis. Chronic left ankle swelling is longstanding and not currently problematic.  She has adequate supply of antiemetics and recently requested a refill for the fentanyl  patch.       ECOG PS- 1 Pain scale- 3 Opioid associated constipation- no  Review of  systems- Review of Systems  Constitutional:  Positive for malaise/fatigue. Negative for chills, fever  and weight loss.  HENT:  Negative for congestion, ear discharge and nosebleeds.   Eyes:  Negative for blurred vision.  Respiratory:  Negative for cough, hemoptysis, sputum production, shortness of breath and wheezing.   Cardiovascular:  Negative for chest pain, palpitations, orthopnea and claudication.  Gastrointestinal:  Negative for abdominal pain, blood in stool, constipation, diarrhea, heartburn, melena, nausea and vomiting.  Genitourinary:  Negative for dysuria, flank pain, frequency, hematuria and urgency.  Musculoskeletal:  Negative for back pain, joint pain and myalgias.       Left ankle pain  Skin:  Negative for rash.  Neurological:  Negative for dizziness, tingling, focal weakness, seizures, weakness and headaches.  Endo/Heme/Allergies:  Does not bruise/bleed easily.  Psychiatric/Behavioral:  Negative for depression and suicidal ideas. The patient does not have insomnia.       Allergies[1]   Past Medical History:  Diagnosis Date   Ectopic pregnancy    Hypertension    Miscarriage      Past Surgical History:  Procedure Laterality Date   CYST REMOVAL HAND Right    IR IMAGING GUIDED PORT INSERTION  02/27/2024   LIVER BIOPSY     SALPINGECTOMY     Etopic Pregnancy    Social History   Socioeconomic History   Marital status: Married    Spouse name: Ozell   Number of children: 3   Years of education: Not on file   Highest education level: Not on file  Occupational History   Not on file  Tobacco Use   Smoking status: Former    Types: Cigarettes   Smokeless tobacco: Never  Vaping Use   Vaping status: Former   Quit date: 02/13/2024  Substance and Sexual Activity   Alcohol use: No   Drug use: No   Sexual activity: Yes  Other Topics Concern   Not on file  Social History Narrative   Not on file   Social Drivers of Health   Tobacco Use: Medium Risk (04/17/2024)   Patient History    Smoking Tobacco Use: Former    Smokeless Tobacco Use: Never    Passive  Exposure: Not on Actuary Strain: Not on file  Food Insecurity: No Food Insecurity (02/23/2024)   Epic    Worried About Programme Researcher, Broadcasting/film/video in the Last Year: Never true    Ran Out of Food in the Last Year: Never true  Transportation Needs: No Transportation Needs (02/23/2024)   Epic    Lack of Transportation (Medical): No    Lack of Transportation (Non-Medical): No  Physical Activity: Not on file  Stress: Not on file  Social Connections: Not on file  Intimate Partner Violence: Not At Risk (02/23/2024)   Epic    Fear of Current or Ex-Partner: No    Emotionally Abused: No    Physically Abused: No    Sexually Abused: No  Depression (PHQ2-9): Low Risk (04/17/2024)   Depression (PHQ2-9)    PHQ-2 Score: 0  Alcohol Screen: Not on file  Housing: Low Risk (02/23/2024)   Epic    Unable to Pay for Housing in the Last Year: No    Number of Times Moved in the Last Year: 0    Homeless in the Last Year: No  Utilities: Not At Risk (02/23/2024)   Epic    Threatened with loss of utilities: No  Health Literacy: Not on file    Family History  Problem Relation Age of Onset   Aneurysm Mother    Hypertension Mother    Heart Problems Father    Breast cancer Sister 39   COPD Half-Sister     Current Medications[2]  Physical exam:  Vitals:   04/17/24 1023  BP: 119/64  Pulse: 99  Resp: 20  Temp: 98 F (36.7 C)  TempSrc: Tympanic  SpO2: 96%  Weight: (!) 320 lb 1.6 oz (145.2 kg)  Height: 5' 10 (1.778 m)   Physical Exam Cardiovascular:     Rate and Rhythm: Normal rate and regular rhythm.     Heart sounds: Normal heart sounds.  Pulmonary:     Effort: Pulmonary effort is normal.     Breath sounds: Normal breath sounds.  Skin:    General: Skin is warm and dry.  Neurological:     Mental Status: She is alert and oriented to person, place, and time.      I have personally reviewed labs listed below:    Latest Ref Rng & Units 04/17/2024    9:54 AM  CMP  Glucose  70 - 99 mg/dL 832   BUN 6 - 20 mg/dL 14   Creatinine 9.55 - 1.00 mg/dL 9.33   Sodium 864 - 854 mmol/L 137   Potassium 3.5 - 5.1 mmol/L 4.1   Chloride 98 - 111 mmol/L 100   CO2 22 - 32 mmol/L 27   Calcium  8.9 - 10.3 mg/dL 9.6   Total Protein 6.5 - 8.1 g/dL 7.1   Total Bilirubin 0.0 - 1.2 mg/dL 0.5   Alkaline Phos 38 - 126 U/L 111   AST 15 - 41 U/L 38   ALT 0 - 44 U/L 21       Latest Ref Rng & Units 04/17/2024    9:54 AM  CBC  WBC 4.0 - 10.5 K/uL 5.1   Hemoglobin 12.0 - 15.0 g/dL 86.3   Hematocrit 63.9 - 46.0 % 42.6   Platelets 150 - 400 K/uL 155     Assessment and plan- Patient is a 58 y.o. female with history of stage IV colon adenocarcinoma here for on treatment assessment prior to cycle 4 of palliative FOLFOX chemotherapy along with panitumumab   Assessment and Plan    Stage IV colorectal cancer Undergoing FOLFOX and panitumumab . Rising CEA suggests possible progression, but current regimen continued for 6 cycles before restaging. Blood counts and organ function normal. Neuropathy managed. - Administered cycle four of FOLFOX and panitumumab . - Planned cycle five of FOLFOX and panitumumab  on December 31. - Scheduled pump disconnect two days after cycle five. - Planned restaging scan after six treatments, likely in January. - Reviewed CBC, hemoglobin, and platelets; all within normal limits.  Chemotherapy-induced peripheral neuropathy Symptoms improved with gabapentin  300 mg twice daily. Higher doses impaired gait and sensation. Oxaliplatin  dose reduced to mitigate neuropathy risk. Aware of gabapentin  adjustment options. - Continued gabapentin  300 mg twice daily. - Educated on option to increase nighttime gabapentin  to 600 mg if symptoms worsen. - Continued reduced dose of oxaliplatin  in chemotherapy regimen.  Neoplasm-related pain, chronic Chronic pain well controlled with fentanyl  patch and Dilaudid . Minimal pain and reduced need for breakthrough analgesia. - Continued  fentanyl  patch and Dilaudid  for analgesia. - Sent refill request for fentanyl  patch.         Visit Diagnosis 1. Colorectal cancer, stage IV (HCC)   2. Encounter for antineoplastic chemotherapy   3. Encounter for monoclonal antibody treatment for malignancy   4. Neoplasm related pain  Dr. Annah Skene, MD, MPH [facility] [phone] 04/17/2024 1:44 PM                   [1]  Allergies Allergen Reactions   Penicillins Other (See Comments)    joints lock up  Joints lock becomes immobile, joint pain  [2]  Current Outpatient Medications:    doxycycline  (VIBRA -TABS) 100 MG tablet, Take 1 tablet (100 mg total) by mouth 2 (two) times daily., Disp: 60 tablet, Rfl: 1   fentaNYL  (DURAGESIC ) 12 MCG/HR, Place 1 patch onto the skin every 3 (three) days., Disp: 10 patch, Rfl: 0   gabapentin  (NEURONTIN ) 300 MG capsule, Take 1 capsule (300 mg total) by mouth 3 (three) times daily., Disp: 90 capsule, Rfl: 1   hydrocortisone  cream 1 %, Apply 1 Application topically 2 (two) times daily., Disp: 28 g, Rfl: 1   HYDROmorphone  (DILAUDID ) 2 MG tablet, Take 1 tablet (2 mg total) by mouth every 3 (three) hours., Disp: 90 tablet, Rfl: 0   lisinopril -hydrochlorothiazide  (ZESTORETIC ) 20-12.5 MG tablet, Take 1 tablet by mouth daily., Disp: 30 tablet, Rfl: 2   ondansetron  (ZOFRAN ) 8 MG tablet, Take by mouth., Disp: , Rfl:    polyethylene glycol (MIRALAX ) 17 g packet, Take 17 g by mouth 2 (two) times daily., Disp: 180 packet, Rfl: 0   prochlorperazine  (COMPAZINE ) 10 MG tablet, Take 10 mg by mouth every 6 (six) hours as needed., Disp: , Rfl:    traZODone  (DESYREL ) 50 MG tablet, Take 1 tablet (50 mg total) by mouth at bedtime as needed for sleep., Disp: 30 tablet, Rfl: 0   gabapentin  (NEURONTIN ) 100 MG capsule, Take 1 capsule (100 mg total) by mouth at bedtime. (Patient not taking: Reported on 04/17/2024), Disp: 30 capsule, Rfl: 0   HYDROcodone  bit-homatropine  (HYCODAN) 5-1.5 MG/5ML syrup, Take 5 mLs by mouth every 6 (six) hours as needed for cough. (Patient not taking: Reported on 04/17/2024), Disp: 120 mL, Rfl: 0 No current facility-administered medications for this visit.  Facility-Administered Medications Ordered in Other Visits:    0.9 %  sodium chloride  infusion, , Intravenous, Continuous, Skene Annah BROCKS, MD, Last Rate: 10 mL/hr at 04/17/24 1115, New Bag at 04/17/24 1115   dextrose  5 % solution, , Intravenous, Continuous, Skene Annah BROCKS, MD, Last Rate: 10 mL/hr at 04/17/24 1237, New Bag at 04/17/24 1237   fluorouracil  (ADRUCIL ) 7,000 mg in sodium chloride  0.9 % 110 mL chemo infusion, 2,400 mg/m2 (Treatment Plan Recorded), Intravenous, 1 day or 1 dose, Skene Annah BROCKS, MD   fluorouracil  (ADRUCIL ) chemo injection 1,000 mg, 400 mg/m2 (Treatment Plan Recorded), Intravenous, Once, Skene Annah BROCKS, MD   leucovorin  1,056 mg in dextrose  5 % 250 mL infusion, 400 mg/m2 (Treatment Plan Recorded), Intravenous, Once, Skene Annah BROCKS, MD, Last Rate: 151 mL/hr at 04/17/24 1240, 1,056 mg at 04/17/24 1240   oxaliplatin  (ELOXATIN ) 170 mg in dextrose  5 % 500 mL chemo infusion, 65 mg/m2 (Treatment Plan Recorded), Intravenous, Once, Skene Annah BROCKS, MD, Last Rate: 267 mL/hr at 04/17/24 1242, 170 mg at 04/17/24 1242

## 2024-04-18 LAB — CEA: CEA: 291 ng/mL — ABNORMAL HIGH (ref 0.0–4.7)

## 2024-04-19 ENCOUNTER — Inpatient Hospital Stay: Admitting: Hospice and Palliative Medicine

## 2024-04-19 ENCOUNTER — Inpatient Hospital Stay

## 2024-04-19 ENCOUNTER — Other Ambulatory Visit: Payer: Self-pay

## 2024-04-19 ENCOUNTER — Encounter: Payer: Self-pay | Admitting: Hospice and Palliative Medicine

## 2024-04-19 VITALS — BP 130/68 | HR 78 | Temp 97.3°F | Resp 18

## 2024-04-19 VITALS — BP 116/62 | HR 71 | Temp 98.0°F | Resp 17 | Wt 325.0 lb

## 2024-04-19 DIAGNOSIS — C19 Malignant neoplasm of rectosigmoid junction: Secondary | ICD-10-CM | POA: Diagnosis not present

## 2024-04-19 DIAGNOSIS — Z5111 Encounter for antineoplastic chemotherapy: Secondary | ICD-10-CM | POA: Diagnosis not present

## 2024-04-19 NOTE — Progress Notes (Signed)
 "    Palliative Medicine [facility] at [facility] [phone] [phone]   Name: Melissa Johns Date: 04/19/2024 MRN: [id number]  DOB: [birth year]  Patient Care Team: Patient, No Pcp Per as PCP - General (General Practice) Maurie Rayfield BIRCH, RN as Oncology Nurse Navigator Melanee Annah BROCKS, MD as Consulting Physician (Oncology)    REASON FOR CONSULTATION: Melissa Johns is a 58 y.o. 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  Patient was referred to palliative care to address goals of management symptoms.  SOCIAL HISTORY:     reports that she has quit smoking. Her smoking use included cigarettes. She has never used smokeless tobacco. She reports that she does not drink alcohol and does not use drugs.  Patient is married and lives at home with her husband.  She has 3 daughters.  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  ADVANCE DIRECTIVES:    CODE STATUS:   PAST MEDICAL HISTORY: Past Medical History:  Diagnosis Date   Ectopic pregnancy    Hypertension    Miscarriage     PAST SURGICAL HISTORY:  Past Surgical History:  Procedure Laterality Date   CYST REMOVAL HAND Right    IR IMAGING GUIDED PORT INSERTION  02/27/2024   LIVER BIOPSY     SALPINGECTOMY     Etopic Pregnancy    HEMATOLOGY/ONCOLOGY HISTORY:  Oncology History  Colorectal cancer, stage IV (HCC)  02/18/2024 Initial Diagnosis   Colorectal cancer, stage IV (HCC)   02/23/2024 Cancer Staging   Staging form: Colon and Rectum, AJCC 8th Edition - Clinical stage from 02/23/2024: Stage IVC (cTX, cN2b, pM1c) - Signed by Melanee Annah BROCKS, MD on 02/25/2024   03/01/2024 - 03/04/2024 Chemotherapy   Patient is on Treatment Plan : COLORECTAL FOLFOX + Bevacizumab q14d     03/10/2024 Genetic Testing   Negative genetic testing. No pathogenic variants identified on the [employer] CancerNext+RNA Panel. The report  date is 03/10/2024.  The [employer] CancerNext+RNAinsight Panel includes sequencing, rearrangement analysis, and RNA analysis for the following 40 genes: APC, ATM, BAP1, BARD1, BMPR1A, BRCA1, BRCA2, BRIP1, CDH1, CDKN2A, CHEK2, FH, FLCN, MET, MLH1, MSH2, MSH6, MUTYH, NF1, NTHL1, PALB2, PMS2, PTEN, RAD51C, RAD51D, RPS20, SMAD4, STK11, TP53, TSC1, TSC2, and VHL (sequencing and deletion/duplication); AXIN2, HOXB13, MBD4, MSH3, POLD1 and POLE (sequencing only); EPCAM and GREM1 (deletion/duplication only).   03/20/2024 -  Chemotherapy   Patient is on Treatment Plan : COLORECTAL FOLFOX + Panitumumab  q14d       ALLERGIES:  is allergic to penicillins.  MEDICATIONS:  Current Outpatient Medications  Medication Sig Dispense Refill   doxycycline  (VIBRA -TABS) 100 MG tablet Take 1 tablet (100 mg total) by mouth 2 (two) times daily. 60 tablet 1   fentaNYL  (DURAGESIC ) 12 MCG/HR Place 1 patch onto the skin every 3 (three) days. 10 patch 0   gabapentin  (NEURONTIN ) 100 MG capsule Take 1 capsule (100 mg total) by mouth at bedtime. 30 capsule 0   gabapentin  (NEURONTIN ) 300 MG capsule Take 1 capsule (300 mg total) by mouth 3 (three) times daily. 90 capsule 1   hydrocortisone  cream 1 % Apply 1 Application topically 2 (two) times daily. 28 g 1   HYDROmorphone  (DILAUDID ) 2 MG tablet Take 1 tablet (2 mg total) by mouth every 3 (three) hours. 90 tablet 0   lisinopril -hydrochlorothiazide  (ZESTORETIC ) 20-12.5 MG tablet Take 1 tablet by mouth daily. 30 tablet 2   ondansetron  (ZOFRAN ) 8 MG  tablet Take by mouth.     polyethylene glycol (MIRALAX ) 17 g packet Take 17 g by mouth 2 (two) times daily. 180 packet 0   prochlorperazine  (COMPAZINE ) 10 MG tablet Take 10 mg by mouth every 6 (six) hours as needed.     traZODone  (DESYREL ) 50 MG tablet Take 1 tablet (50 mg total) by mouth at bedtime as needed for sleep. 30 tablet 0   HYDROcodone  bit-homatropine (HYCODAN) 5-1.5 MG/5ML syrup Take 5 mLs by mouth every 6 (six) hours as needed for  cough. (Patient not taking: Reported on 04/19/2024) 120 mL 0   No current facility-administered medications for this visit.    VITAL SIGNS: BP 116/62 (Patient Position: Sitting)   Pulse 71   Temp 98 F (36.7 C) (Tympanic)   Resp 17   Wt (!) 325 lb (147.4 kg)   LMP 12/21/2015   SpO2 98%   BMI 46.63 kg/m  Filed Weights   04/19/24 1335  Weight: (!) 325 lb (147.4 kg)    Estimated body mass index is 46.63 kg/m as calculated from the following:   Height as of 04/17/24: 5' 10 (1.778 m).   Weight as of this encounter: 325 lb (147.4 kg).  LABS: CBC:    Component Value Date/Time   WBC 5.1 04/17/2024 0954   WBC 13.7 (H) 02/20/2024 1907   HGB 13.6 04/17/2024 0954   HCT 42.6 04/17/2024 0954   PLT 155 04/17/2024 0954   MCV 83.4 04/17/2024 0954   NEUTROABS 2.7 04/17/2024 0954   LYMPHSABS 1.7 04/17/2024 0954   MONOABS 0.5 04/17/2024 0954   EOSABS 0.1 04/17/2024 0954   BASOSABS 0.0 04/17/2024 0954   Comprehensive Metabolic Panel:    Component Value Date/Time   NA 137 04/17/2024 0954   K 4.1 04/17/2024 0954   CL 100 04/17/2024 0954   CO2 27 04/17/2024 0954   BUN 14 04/17/2024 0954   CREATININE 0.66 04/17/2024 0954   GLUCOSE 167 (H) 04/17/2024 0954   CALCIUM  9.6 04/17/2024 0954   AST 38 04/17/2024 0954   ALT 21 04/17/2024 0954   ALKPHOS 111 04/17/2024 0954   BILITOT 0.5 04/17/2024 0954   PROT 7.1 04/17/2024 0954   ALBUMIN 3.8 04/17/2024 0954    RADIOGRAPHIC STUDIES: No results found.   PERFORMANCE STATUS (ECOG) : 1 - Symptomatic but completely ambulatory  Review of Systems Unless otherwise noted, a complete review of systems is negative.  Physical Exam General: NAD Pulmonary: Unlabored Extremities: no edema, no joint deformities Skin: no rashes Neurological: Weakness but otherwise nonfocal  IMPRESSION: Follow-up visit.  Patient accompanied by her daughter.  Patient reports she is doing well.  Denies any significant changes or concerns.  No symptomatic  complaints at present.  Neuropathy is well-controlled on gabapentin .  Reviewed ACP documents with patient.  She is interested in completing.  Will refer to social work for ACP clinic.  PLAN: - Continue current scope of treatment - Continue gabapentin   - Referral to social work - Follow-up telephone visit 1-2 month   Patient expressed understanding and was in agreement with this plan. She also understands that She can call the clinic at any time with any questions, concerns, or complaints.     Time Total: 15 minutes  Visit consisted of counseling and education dealing with the complex and emotionally intense issues of symptom management and palliative care in the setting of serious and potentially life-threatening illness.Greater than 50%  of this time was spent counseling and coordinating care related to the above assessment  and plan.  Signed by: Fonda Mower, PhD, NP-C   "

## 2024-04-19 NOTE — Progress Notes (Signed)
 Josh wanted me to message Macario Slick about this patient to have advance directive.

## 2024-04-22 ENCOUNTER — Telehealth: Payer: Self-pay

## 2024-04-22 NOTE — Telephone Encounter (Signed)
 Clinical Social Work was referred by medical provider for advance directives.  CSW attempted to contact patient by phone.  Left voicemail with contact information and request for return call.

## 2024-05-01 ENCOUNTER — Inpatient Hospital Stay

## 2024-05-01 ENCOUNTER — Other Ambulatory Visit: Payer: Self-pay | Admitting: Oncology

## 2024-05-01 ENCOUNTER — Other Ambulatory Visit: Payer: Self-pay

## 2024-05-01 ENCOUNTER — Inpatient Hospital Stay: Admitting: Oncology

## 2024-05-01 ENCOUNTER — Encounter: Payer: Self-pay | Admitting: Oncology

## 2024-05-01 VITALS — BP 134/71 | HR 83 | Temp 98.6°F | Resp 20 | Wt 325.6 lb

## 2024-05-01 VITALS — BP 119/69 | HR 84 | Resp 16

## 2024-05-01 DIAGNOSIS — C19 Malignant neoplasm of rectosigmoid junction: Secondary | ICD-10-CM

## 2024-05-01 DIAGNOSIS — Z803 Family history of malignant neoplasm of breast: Secondary | ICD-10-CM

## 2024-05-01 DIAGNOSIS — G620 Drug-induced polyneuropathy: Secondary | ICD-10-CM | POA: Diagnosis not present

## 2024-05-01 DIAGNOSIS — G893 Neoplasm related pain (acute) (chronic): Secondary | ICD-10-CM | POA: Diagnosis not present

## 2024-05-01 DIAGNOSIS — C787 Secondary malignant neoplasm of liver and intrahepatic bile duct: Secondary | ICD-10-CM | POA: Diagnosis not present

## 2024-05-01 DIAGNOSIS — Z5111 Encounter for antineoplastic chemotherapy: Secondary | ICD-10-CM

## 2024-05-01 DIAGNOSIS — R040 Epistaxis: Secondary | ICD-10-CM

## 2024-05-01 DIAGNOSIS — T451X5A Adverse effect of antineoplastic and immunosuppressive drugs, initial encounter: Secondary | ICD-10-CM | POA: Diagnosis not present

## 2024-05-01 DIAGNOSIS — Z87891 Personal history of nicotine dependence: Secondary | ICD-10-CM

## 2024-05-01 DIAGNOSIS — D6959 Other secondary thrombocytopenia: Secondary | ICD-10-CM | POA: Diagnosis not present

## 2024-05-01 DIAGNOSIS — Z5112 Encounter for antineoplastic immunotherapy: Secondary | ICD-10-CM

## 2024-05-01 LAB — CMP (CANCER CENTER ONLY)
ALT: 22 U/L (ref 0–44)
AST: 50 U/L — ABNORMAL HIGH (ref 15–41)
Albumin: 3.8 g/dL (ref 3.5–5.0)
Alkaline Phosphatase: 110 U/L (ref 38–126)
Anion gap: 10 (ref 5–15)
BUN: 12 mg/dL (ref 6–20)
CO2: 28 mmol/L (ref 22–32)
Calcium: 9.6 mg/dL (ref 8.9–10.3)
Chloride: 99 mmol/L (ref 98–111)
Creatinine: 0.62 mg/dL (ref 0.44–1.00)
GFR, Estimated: >60 mL/min
Glucose, Bld: 110 mg/dL — ABNORMAL HIGH (ref 70–99)
Potassium: 3.7 mmol/L (ref 3.5–5.1)
Sodium: 137 mmol/L (ref 135–145)
Total Bilirubin: 0.5 mg/dL (ref 0.0–1.2)
Total Protein: 7.1 g/dL (ref 6.5–8.1)

## 2024-05-01 LAB — CBC WITH DIFFERENTIAL (CANCER CENTER ONLY)
Abs Immature Granulocytes: 0.02 K/uL (ref 0.00–0.07)
Basophils Absolute: 0 K/uL (ref 0.0–0.1)
Basophils Relative: 1 %
Eosinophils Absolute: 0.1 K/uL (ref 0.0–0.5)
Eosinophils Relative: 3 %
HCT: 41.5 % (ref 36.0–46.0)
Hemoglobin: 13.6 g/dL (ref 12.0–15.0)
Immature Granulocytes: 0 %
Lymphocytes Relative: 36 %
Lymphs Abs: 2 K/uL (ref 0.7–4.0)
MCH: 27.3 pg (ref 26.0–34.0)
MCHC: 32.8 g/dL (ref 30.0–36.0)
MCV: 83.3 fL (ref 80.0–100.0)
Monocytes Absolute: 0.7 K/uL (ref 0.1–1.0)
Monocytes Relative: 13 %
Neutro Abs: 2.6 K/uL (ref 1.7–7.7)
Neutrophils Relative %: 47 %
Platelet Count: 130 K/uL — ABNORMAL LOW (ref 150–400)
RBC: 4.98 MIL/uL (ref 3.87–5.11)
RDW: 20 % — ABNORMAL HIGH (ref 11.5–15.5)
WBC Count: 5.4 K/uL (ref 4.0–10.5)
nRBC: 0 % (ref 0.0–0.2)

## 2024-05-01 LAB — MAGNESIUM: Magnesium: 2 mg/dL (ref 1.7–2.4)

## 2024-05-01 MED ORDER — LISINOPRIL-HYDROCHLOROTHIAZIDE 20-12.5 MG PO TABS
1.0000 | ORAL_TABLET | Freq: Every day | ORAL | 1 refills | Status: AC
  Filled 2024-05-01 (×2): qty 30, 30d supply, fill #0
  Filled 2024-05-29: qty 30, 30d supply, fill #1

## 2024-05-01 MED ORDER — DEXTROSE 5 % IV SOLN
INTRAVENOUS | Status: DC
  Filled 2024-05-01: qty 250

## 2024-05-01 MED ORDER — DEXAMETHASONE SOD PHOSPHATE PF 10 MG/ML IJ SOLN
10.0000 mg | Freq: Once | INTRAMUSCULAR | Status: AC
  Administered 2024-05-01: 10 mg via INTRAVENOUS

## 2024-05-01 MED ORDER — OXALIPLATIN CHEMO INJECTION 100 MG/20ML
65.0000 mg/m2 | Freq: Once | INTRAVENOUS | Status: AC
  Administered 2024-05-01: 170 mg via INTRAVENOUS
  Filled 2024-05-01: qty 34

## 2024-05-01 MED ORDER — SODIUM CHLORIDE 0.9 % IV SOLN
2400.0000 mg/m2 | INTRAVENOUS | Status: DC
  Administered 2024-05-01: 7000 mg via INTRAVENOUS
  Filled 2024-05-01: qty 140

## 2024-05-01 MED ORDER — SODIUM CHLORIDE 0.9 % IV SOLN
INTRAVENOUS | Status: DC
  Filled 2024-05-01: qty 250

## 2024-05-01 MED ORDER — PALONOSETRON HCL INJECTION 0.25 MG/5ML
0.2500 mg | Freq: Once | INTRAVENOUS | Status: AC
  Administered 2024-05-01: 0.25 mg via INTRAVENOUS
  Filled 2024-05-01: qty 5

## 2024-05-01 MED ORDER — TRAZODONE HCL 50 MG PO TABS
50.0000 mg | ORAL_TABLET | Freq: Every evening | ORAL | 0 refills | Status: AC | PRN
  Filled 2024-05-01 (×2): qty 30, 30d supply, fill #0

## 2024-05-01 MED ORDER — ONDANSETRON HCL 8 MG PO TABS
8.0000 mg | ORAL_TABLET | Freq: Three times a day (TID) | ORAL | 1 refills | Status: DC | PRN
  Filled 2024-05-01: qty 30, 10d supply, fill #0

## 2024-05-01 MED ORDER — PROCHLORPERAZINE MALEATE 10 MG PO TABS
10.0000 mg | ORAL_TABLET | Freq: Four times a day (QID) | ORAL | 1 refills | Status: DC | PRN
  Filled 2024-05-01: qty 30, 8d supply, fill #0

## 2024-05-01 MED ORDER — FLUOROURACIL CHEMO INJECTION 2.5 GM/50ML
400.0000 mg/m2 | Freq: Once | INTRAVENOUS | Status: AC
  Administered 2024-05-01: 1000 mg via INTRAVENOUS
  Filled 2024-05-01: qty 20

## 2024-05-01 MED ORDER — LEUCOVORIN CALCIUM INJECTION 350 MG
400.0000 mg/m2 | Freq: Once | INTRAVENOUS | Status: AC
  Administered 2024-05-01: 1056 mg via INTRAVENOUS
  Filled 2024-05-01: qty 50

## 2024-05-01 MED ORDER — SODIUM CHLORIDE 0.9 % IV SOLN
6.0000 mg/kg | Freq: Once | INTRAVENOUS | Status: AC
  Administered 2024-05-01: 800 mg via INTRAVENOUS
  Filled 2024-05-01: qty 40

## 2024-05-01 NOTE — Patient Instructions (Signed)

## 2024-05-01 NOTE — Progress Notes (Signed)
 "    Hematology/Oncology Consult note [facility]  [phone] [phone]  Patient Care Team: Patient, No Pcp Per as PCP - General (General Practice) Maurie Rayfield BIRCH, RN as Oncology Nurse Navigator Melanee Annah BROCKS, MD as Consulting Physician (Oncology)   Name of the patient: Melissa Johns  [id number]  12/23/1965   Date of visit: 05/01/2024  Diagnosis-  Cancer Staging  Colorectal cancer, stage IV [facility]) Staging form: Colon and Rectum, AJCC 8th Edition - Clinical stage from 02/23/2024: Stage IVC (cTX, cN2b, pM1c) - Signed by Melanee Annah BROCKS, MD on 02/25/2024    Chief complaint/ Reason for visit-on treatment assessment prior to cycle 5 of palliative FOLFOX chemotherapy along with panitumumab   Heme/Onc history:  patient is a 58 year old female with a past medical history significant for hypertension who presented with symptoms of epigastric and right upper quadrant abdominal pain which has been ongoing for 2 weeks.She underwent CT abdomen pelvis with contrast in the ER which showed significant thoracic adenopathy, multiple hepatic masses.  Left greater than right adrenal nodules.  There was evidence of intra-abdominal adenopathy including index gastrohepatic lymph node measuring 2.2 cm.  Extensive adenopathy within the sigmoid mesocolon with a nodal mass of 3.8 x 4.2 cm.  Findings concerning for rectosigmoid primary given the eccentric right wall thickening.   CT angio chest did not show any evidence of large central pulmonary embolus or subsegmental pulmonary embolism.  Bulky mediastinal and bilateral hilar adenopathy. Ultrasound-guided liver biopsy showed metastatic moderately differentiated adenocarcinoma consistent with colorectal primary.  Focal CK7 positive bile ducts.  Cells positive for CK20 and CDX2 and negative for CK7 GATA3 PAX8 and TTF-1.  Tempus testing on the specimen showed negative for KRAS and NRAS.  APC, T p53 and SMAD4 mutation.  DYPD  normal.  MSI stable.  HER2 equivocal by IHC and negative by FISH.   Patient was started on palliative FOLFOX chemotherapy on 03/01/2024.  Plan to start panitumumab  with cycle 3  Interval history- Melissa Johns is a 58 year old female with metastatic rectosigmoid colon cancer who presents for evaluation of chemotherapy-related toxicities.  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 Gabapentin  provides symptomatic relief, though she reports morning hyperactivity and difficulty concentrating. Neuropathy remains manageable, and she tolerates the current reduced dose of oxaliplatin .  For neoplasm-related pain, she uses a fentanyl  patch, recently extending the interval to every four days, and Dilaudid  at night.  She has developed chemotherapy-induced skin toxicity, including facial rash with erythema, peeling, and persistent wrinkling of her fingers. She uses hydrocortisone  cream at night, CeraVe moisturizer in the morning, and doxycycline . No severe acneiform lesions are present, but she is prepared to initiate topical antibiotics if needed.  She experiences mild chemotherapy-induced thrombocytopenia (platelets 130) and morning epistaxis, with clotted blood in her nose and occasional nasal spasms. She discontinued Afrin due to risk of rebound bleeding and is considering saline nasal spray and a humidifier, as her sleep apnea mask exacerbates nasal dryness and bleeding.  She has not seen her primary care physician recently due to scheduling conflicts. She continues to monitor her blood pressure at home, with antihypertensive medication prescribed by an ER physician. Blood glucose remains stable at 110 mg/dL following chemotherapy infusions.      History of Present Illness   ECOG PS- 1 Pain scale- 0 Opioid associated constipation- no  Review of systems- Review of Systems   Skin:  Positive for  rash.  Neurological:  Positive for sensory change (Peripheral neuropathy).      Allergies[1]   Past Medical History:  Diagnosis Date   Ectopic pregnancy    Hypertension    Miscarriage      Past Surgical History:  Procedure Laterality Date   CYST REMOVAL HAND Right    IR IMAGING GUIDED PORT INSERTION  02/27/2024   LIVER BIOPSY     SALPINGECTOMY     Etopic Pregnancy    Social History   Socioeconomic History   Marital status: Married    Spouse name: Ozell   Number of children: 3   Years of education: Not on file   Highest education level: Not on file  Occupational History   Not on file  Tobacco Use   Smoking status: Former    Types: Cigarettes   Smokeless tobacco: Never  Vaping Use   Vaping status: Former   Quit date: 02/13/2024  Substance and Sexual Activity   Alcohol use: No   Drug use: No   Sexual activity: Yes  Other Topics Concern   Not on file  Social History Narrative   Not on file   Social Drivers of Health   Tobacco Use: Medium Risk (05/01/2024)   Patient History    Smoking Tobacco Use: Former    Smokeless Tobacco Use: Never    Passive Exposure: Not on Actuary Strain: Not on file  Food Insecurity: No Food Insecurity (02/23/2024)   Epic    Worried About Programme Researcher, Broadcasting/film/video in the Last Year: Never true    Ran Out of Food in the Last Year: Never true  Transportation Needs: No Transportation Needs (02/23/2024)   Epic    Lack of Transportation (Medical): No    Lack of Transportation (Non-Medical): No  Physical Activity: Not on file  Stress: Not on file  Social Connections: Not on file  Intimate Partner Violence: Not At Risk (02/23/2024)   Epic    Fear of Current or Ex-Partner: No    Emotionally Abused: No    Physically Abused: No    Sexually Abused: No  Depression (PHQ2-9): Low Risk (04/19/2024)   Depression (PHQ2-9)    PHQ-2 Score: 0  Alcohol Screen: Not on file  Housing: Low Risk (02/23/2024)    Epic    Unable to Pay for Housing in the Last Year: No    Number of Times Moved in the Last Year: 0    Homeless in the Last Year: No  Utilities: Not At Risk (02/23/2024)   Epic    Threatened with loss of utilities: No  Health Literacy: Not on file    Family History  Problem Relation Age of Onset   Aneurysm Mother    Hypertension Mother    Heart Problems Father    Breast cancer Sister 20   COPD Half-Sister     Current Medications[2]  Physical exam:  Vitals:   05/01/24 0841  BP: (!) 142/73  Pulse: 83  Resp: 20  Temp: 98.6 F (37 C)  SpO2: 100%  Weight: (!) 325 lb 9.6 oz (147.7 kg)   Physical Exam Cardiovascular:     Rate and Rhythm: Normal rate and regular rhythm.     Heart sounds: Normal heart sounds.  Pulmonary:     Effort: Pulmonary effort is normal.     Breath sounds: Normal breath sounds.  Skin:    General: Skin is warm and dry.     Comments: Mild erythema noted over facial skin  Neurological:     Mental Status: She is alert and oriented to person, place, and time.      I have personally reviewed labs listed below:    Latest Ref Rng & Units 05/01/2024    8:23 AM  CMP  Glucose 70 - 99 mg/dL 889   BUN 6 - 20 mg/dL 12   Creatinine 9.55 - 1.00 mg/dL 9.37   Sodium 864 - 854 mmol/L 137   Potassium 3.5 - 5.1 mmol/L 3.7   Chloride 98 - 111 mmol/L 99   CO2 22 - 32 mmol/L 28   Calcium  8.9 - 10.3 mg/dL 9.6   Total Protein 6.5 - 8.1 g/dL 7.1   Total Bilirubin 0.0 - 1.2 mg/dL 0.5   Alkaline Phos 38 - 126 U/L 110   AST 15 - 41 U/L 50   ALT 0 - 44 U/L 22       Latest Ref Rng & Units 05/01/2024    8:23 AM  CBC  WBC 4.0 - 10.5 K/uL 5.4   Hemoglobin 12.0 - 15.0 g/dL 86.3   Hematocrit 63.9 - 46.0 % 41.5   Platelets 150 - 400 K/uL 130      Assessment and plan- Patient is a 58 y.o. female with history of metastatic colon adenocarcinoma here for on treatment assessment prior to cycle 5 of palliative FOLFOX chemotherapy along with panitumumab   Assessment  and Plan    Stage IV metastatic rectosigmoid colon cancer Undergoing active treatment with favorable response indicated by decreased CEA levels. FOLFOX-based chemotherapy remains optimal. - Continue FOLFOX-based chemotherapy regimen. - Order CT scan in three weeks to assess response. - Review imaging results at next visit after seventh treatment.  Antineoplastic chemotherapy  management On fifth cycle of FOLFOX panitumumab  chemotherapy, with plans for six cycles before imaging. Hematologic, renal, hepatic function, and blood glucose remain stable. - Administered fifth cycle of chemotherapy. - Continue monitoring blood counts, renal, and hepatic function.  Chemotherapy-induced peripheral neuropathy Experiences manageable oxaliplatin -induced neuropathy. Gabapentin  effective for nocturnal symptoms. Reduced oxaliplatin  dose; further reduction considered if symptoms worsen. - Continue current dosing of gabapentin  (one in the morning, two at night). - Continue reduced dose of oxaliplatin . - Reassess need for further dose reduction or discontinuation if neuropathy worsens.  Neoplasm-related pain Uses fentanyl  patch and Dilaudid  with consideration for opioid reduction if pain controlled. Ongoing discussion on tapering with caution for breakthrough pain. - Continue fentanyl  patch every four days. - Continue Dilaudid  at night as needed. - Monitor pain levels and adjust analgesic regimen as tolerated. - Discuss further reduction or discontinuation of fentanyl  patch if pain remains controlled.  Chemotherapy-induced skin toxicity Experiences skin dryness, peeling, and mild rash. Uses hydrocortisone  cream, doxycycline , and moisturizer. No severe acneiform lesions. Discussed risks of prolonged steroid use on face. - Continue hydrocortisone  cream at night, with caution regarding prolonged use on the face. - Continue doxycycline . - Encourage regular use of moisturizer (e.g., CeraVe). - Monitor for  worsening rash or development of acneiform lesions. - Add topical antibiotic if acneiform lesions develop.  Chemotherapy-induced thrombocytopenia Mild thrombocytopenia (platelets 130) expected with oxaliplatin , without significant bleeding complications. - Continue current chemotherapy regimen unless thrombocytopenia worsens.  Chemotherapy-induced epistaxis Reports morning clotted blood in nose, likely due to mucosal dryness and thrombocytopenia. Afrin discontinued due to rebound bleeding risk. Saline spray and humidifier recommended. - Discontinue Afrin. - Recommend over-the-counter saline nasal spray for moisturization. - Advise use of humidifier at bedside to reduce nasal dryness. - Monitor for worsening or torrential  epistaxis; consider Afrin only if severe bleeding occurs.         Visit Diagnosis 1. Encounter for antineoplastic chemotherapy   2. Colorectal cancer, stage IV (HCC)   3. Encounter for monoclonal antibody treatment for malignancy      Dr. Annah Skene, MD, MPH [facility] [phone] 05/01/2024 9:20 AM                   [1]  Allergies Allergen Reactions   Penicillins Other (See Comments)    joints lock up  Joints lock becomes immobile, joint pain  [2]  Current Outpatient Medications:    doxycycline  (VIBRA -TABS) 100 MG tablet, Take 1 tablet (100 mg total) by mouth 2 (two) times daily., Disp: 60 tablet, Rfl: 1   fentaNYL  (DURAGESIC ) 12 MCG/HR, Place 1 patch onto the skin every 3 (three) days., Disp: 10 patch, Rfl: 0   gabapentin  (NEURONTIN ) 100 MG capsule, Take 1 capsule (100 mg total) by mouth at bedtime., Disp: 30 capsule, Rfl: 0   gabapentin  (NEURONTIN ) 300 MG capsule, Take 1 capsule (300 mg total) by mouth 3 (three) times daily., Disp: 90 capsule, Rfl: 1   hydrocortisone  cream 1 %, Apply 1 Application topically 2 (two) times daily., Disp: 28 g, Rfl: 1   HYDROmorphone  (DILAUDID ) 2 MG tablet, Take 1 tablet (2 mg  total) by mouth every 3 (three) hours., Disp: 90 tablet, Rfl: 0   ondansetron  (ZOFRAN ) 8 MG tablet, Take by mouth., Disp: , Rfl:    polyethylene glycol (MIRALAX ) 17 g packet, Take 17 g by mouth 2 (two) times daily., Disp: 180 packet, Rfl: 0   prochlorperazine  (COMPAZINE ) 10 MG tablet, Take 10 mg by mouth every 6 (six) hours as needed., Disp: , Rfl:    HYDROcodone  bit-homatropine (HYCODAN) 5-1.5 MG/5ML syrup, Take 5 mLs by mouth every 6 (six) hours as needed for cough. (Patient not taking: Reported on 05/01/2024), Disp: 120 mL, Rfl: 0   lisinopril -hydrochlorothiazide  (ZESTORETIC ) 20-12.5 MG tablet, Take 1 tablet by mouth daily., Disp: 30 tablet, Rfl: 1   ondansetron  (ZOFRAN ) 8 MG tablet, Take 1 tablet (8 mg total) by mouth every 8 (eight) hours as needed for nausea or vomiting. Start on the third day after chemotherapy., Disp: 30 tablet, Rfl: 1   prochlorperazine  (COMPAZINE ) 10 MG tablet, Take 1 tablet (10 mg total) by mouth every 6 (six) hours as needed for nausea or vomiting., Disp: 30 tablet, Rfl: 1   traZODone  (DESYREL ) 50 MG tablet, Take 1 tablet (50 mg total) by mouth at bedtime as needed for sleep., Disp: 30 tablet, Rfl: 0  "

## 2024-05-01 NOTE — Progress Notes (Signed)
 Ok to round dose of vectibix  to 800mg  per dr Melanee

## 2024-05-01 NOTE — Patient Instructions (Signed)
 [facility] - A DEPT OF [name]  Discharge Instructions: Thank you for choosing [facility] to provide your oncology and hematology care.  If you have a lab appointment with the Cancer Center, please go directly to the Cancer Center and check in at the registration area.  Wear comfortable clothing and clothing appropriate for easy access to any Portacath or PICC line.   We strive to give you quality time with your provider. You may need to reschedule your appointment if you arrive late (15 or more minutes).  Arriving late affects you and other patients whose appointments are after yours.  Also, if you miss three or more appointments without notifying the office, you may be dismissed from the clinic at the providers discretion.      For prescription refill requests, have your pharmacy contact our office and allow 72 hours for refills to be completed.    Today you received the following chemotherapy and/or immunotherapy agents- vectibix , oxaliplatin , leucovorin , 5FU      To help prevent nausea and vomiting after your treatment, we encourage you to take your nausea medication as directed.  BELOW ARE SYMPTOMS THAT SHOULD BE REPORTED IMMEDIATELY: *FEVER GREATER THAN 100.4 F (38 C) OR HIGHER *CHILLS OR SWEATING *NAUSEA AND VOMITING THAT IS NOT CONTROLLED WITH YOUR NAUSEA MEDICATION *UNUSUAL SHORTNESS OF BREATH *UNUSUAL BRUISING OR BLEEDING *URINARY PROBLEMS (pain or burning when urinating, or frequent urination) *BOWEL PROBLEMS (unusual diarrhea, constipation, pain near the anus) TENDERNESS IN MOUTH AND THROAT WITH OR WITHOUT PRESENCE OF ULCERS (sore throat, sores in mouth, or a toothache) UNUSUAL RASH, SWELLING OR PAIN  UNUSUAL VAGINAL DISCHARGE OR ITCHING   Items with * indicate a potential emergency and should be followed up as soon as possible or go to the Emergency Department if any problems should occur.  Please show the CHEMOTHERAPY  ALERT CARD or IMMUNOTHERAPY ALERT CARD at check-in to the Emergency Department and triage nurse.  Should you have questions after your visit or need to cancel or reschedule your appointment, please contact [facility] - A DEPT OF [name]  [phone] and follow the prompts.  Office hours are 8:00 a.m. to 4:30 p.m. Monday - Friday. Please note that voicemails left after 4:00 p.m. may not be returned until the following business day.  We are closed weekends and major holidays. You have access to a nurse at all times for urgent questions. Please call the main number to the clinic [phone] and follow the prompts.  For any non-urgent questions, you may also contact your provider using MyChart. We now offer e-Visits for anyone 30 and older to request care online for non-urgent symptoms. For details visit [email or web address].   Also download the MyChart app! Go to the app store, search MyChart, open the app, select [place], and log in with your MyChart username and password.

## 2024-05-02 LAB — CEA: CEA: 98.1 ng/mL — ABNORMAL HIGH (ref 0.0–4.7)

## 2024-05-03 ENCOUNTER — Telehealth: Payer: Self-pay | Admitting: Oncology

## 2024-05-03 ENCOUNTER — Encounter: Payer: Self-pay | Admitting: Oncology

## 2024-05-03 ENCOUNTER — Inpatient Hospital Stay

## 2024-05-03 ENCOUNTER — Inpatient Hospital Stay: Attending: Oncology

## 2024-05-03 DIAGNOSIS — Z79899 Other long term (current) drug therapy: Secondary | ICD-10-CM | POA: Insufficient documentation

## 2024-05-03 DIAGNOSIS — Z5112 Encounter for antineoplastic immunotherapy: Secondary | ICD-10-CM | POA: Insufficient documentation

## 2024-05-03 DIAGNOSIS — C19 Malignant neoplasm of rectosigmoid junction: Secondary | ICD-10-CM | POA: Insufficient documentation

## 2024-05-03 DIAGNOSIS — C787 Secondary malignant neoplasm of liver and intrahepatic bile duct: Secondary | ICD-10-CM | POA: Insufficient documentation

## 2024-05-03 DIAGNOSIS — Z5111 Encounter for antineoplastic chemotherapy: Secondary | ICD-10-CM | POA: Insufficient documentation

## 2024-05-03 NOTE — Telephone Encounter (Signed)
 Called pt to sched CT - pt confirmed date/time/location - pt requested appt reminder via mychart and mail - LH

## 2024-05-04 ENCOUNTER — Other Ambulatory Visit: Payer: Self-pay | Admitting: Oncology

## 2024-05-04 DIAGNOSIS — C19 Malignant neoplasm of rectosigmoid junction: Secondary | ICD-10-CM

## 2024-05-06 ENCOUNTER — Encounter: Payer: Self-pay | Admitting: Oncology

## 2024-05-06 ENCOUNTER — Other Ambulatory Visit: Payer: Self-pay

## 2024-05-06 NOTE — Telephone Encounter (Signed)
 Received refill request for another Trazodone  50mg  script (last filled 05/01/24) with notes indicating Should be 2 pills at night.  Outbound call to patient who indicated at her second to last visit with Dr. Melanee she mentioned about her taking 2 pills nightly.  Reviewed last 2 visits, there was mention of increasing gabapentin  but nothing noted about increasing trazadone.  Informed patient I would touch base with Dr. Melanee and follow up with a my chart message; patient verbalized agreement.

## 2024-05-06 NOTE — Telephone Encounter (Signed)
 Called back patient to clarify the following per Dr. Melanee Usually the starting dose is 50 mg. Has she tried 50 mg before? If not we can refill at this time but only 50 mg and she will take 1 tablet daily and see if that works first before going up to 100 mg.  Outbound call to patient who said she's previously taken 100 that Dr. Melanee prescribed 2 sessions ago (I can't find this in the system nor the history). I asked her if she was prescribed 100mg  previously by another provider but she insists she's only received it from Dr. Melanee. Informed again I would touch base with provider and send her an update via my chart.

## 2024-05-06 NOTE — Telephone Encounter (Signed)
 Per Dr. Melanee can she try 50 mg for next 2 weeks and see if it helps. if it doesn't she can increase to 100 mg.  we will fill 50 mg prescription for 2 weeks.

## 2024-05-07 ENCOUNTER — Other Ambulatory Visit (HOSPITAL_COMMUNITY): Payer: Self-pay

## 2024-05-07 ENCOUNTER — Other Ambulatory Visit: Payer: Self-pay

## 2024-05-08 ENCOUNTER — Other Ambulatory Visit: Payer: Self-pay

## 2024-05-08 MED ORDER — TRAZODONE HCL 100 MG PO TABS
100.0000 mg | ORAL_TABLET | Freq: Every day | ORAL | 1 refills | Status: AC
  Filled 2024-05-08: qty 30, 30d supply, fill #0

## 2024-05-08 NOTE — Addendum Note (Signed)
 Addended by: Torria Fromer on: 05/08/2024 11:56 AM   Modules accepted: Orders

## 2024-05-15 ENCOUNTER — Inpatient Hospital Stay: Admitting: Oncology

## 2024-05-15 ENCOUNTER — Inpatient Hospital Stay

## 2024-05-15 ENCOUNTER — Encounter: Payer: Self-pay | Admitting: Oncology

## 2024-05-15 VITALS — BP 124/75 | HR 99 | Temp 97.6°F | Resp 19 | Ht 70.0 in | Wt 328.7 lb

## 2024-05-15 DIAGNOSIS — C19 Malignant neoplasm of rectosigmoid junction: Secondary | ICD-10-CM | POA: Diagnosis not present

## 2024-05-15 DIAGNOSIS — G4700 Insomnia, unspecified: Secondary | ICD-10-CM

## 2024-05-15 DIAGNOSIS — T451X5A Adverse effect of antineoplastic and immunosuppressive drugs, initial encounter: Secondary | ICD-10-CM | POA: Diagnosis not present

## 2024-05-15 DIAGNOSIS — G893 Neoplasm related pain (acute) (chronic): Secondary | ICD-10-CM

## 2024-05-15 DIAGNOSIS — Z803 Family history of malignant neoplasm of breast: Secondary | ICD-10-CM

## 2024-05-15 DIAGNOSIS — Z87891 Personal history of nicotine dependence: Secondary | ICD-10-CM

## 2024-05-15 DIAGNOSIS — G4709 Other insomnia: Secondary | ICD-10-CM

## 2024-05-15 DIAGNOSIS — C787 Secondary malignant neoplasm of liver and intrahepatic bile duct: Secondary | ICD-10-CM | POA: Diagnosis not present

## 2024-05-15 DIAGNOSIS — Z5111 Encounter for antineoplastic chemotherapy: Secondary | ICD-10-CM

## 2024-05-15 DIAGNOSIS — D6959 Other secondary thrombocytopenia: Secondary | ICD-10-CM

## 2024-05-15 DIAGNOSIS — Z5112 Encounter for antineoplastic immunotherapy: Secondary | ICD-10-CM

## 2024-05-15 DIAGNOSIS — G620 Drug-induced polyneuropathy: Secondary | ICD-10-CM

## 2024-05-15 LAB — CMP (CANCER CENTER ONLY)
ALT: 20 U/L (ref 0–44)
AST: 42 U/L — ABNORMAL HIGH (ref 15–41)
Albumin: 3.9 g/dL (ref 3.5–5.0)
Alkaline Phosphatase: 94 U/L (ref 38–126)
Anion gap: 11 (ref 5–15)
BUN: 18 mg/dL (ref 6–20)
CO2: 27 mmol/L (ref 22–32)
Calcium: 10 mg/dL (ref 8.9–10.3)
Chloride: 100 mmol/L (ref 98–111)
Creatinine: 0.64 mg/dL (ref 0.44–1.00)
GFR, Estimated: >60 mL/min
Glucose, Bld: 124 mg/dL — ABNORMAL HIGH (ref 70–99)
Potassium: 3.8 mmol/L (ref 3.5–5.1)
Sodium: 139 mmol/L (ref 135–145)
Total Bilirubin: 0.6 mg/dL (ref 0.0–1.2)
Total Protein: 7 g/dL (ref 6.5–8.1)

## 2024-05-15 LAB — CBC WITH DIFFERENTIAL (CANCER CENTER ONLY)
Abs Immature Granulocytes: 0.01 K/uL (ref 0.00–0.07)
Basophils Absolute: 0 K/uL (ref 0.0–0.1)
Basophils Relative: 1 %
Eosinophils Absolute: 0.1 K/uL (ref 0.0–0.5)
Eosinophils Relative: 1 %
HCT: 43.3 % (ref 36.0–46.0)
Hemoglobin: 14.3 g/dL (ref 12.0–15.0)
Immature Granulocytes: 0 %
Lymphocytes Relative: 37 %
Lymphs Abs: 2.1 K/uL (ref 0.7–4.0)
MCH: 27.8 pg (ref 26.0–34.0)
MCHC: 33 g/dL (ref 30.0–36.0)
MCV: 84.2 fL (ref 80.0–100.0)
Monocytes Absolute: 0.7 K/uL (ref 0.1–1.0)
Monocytes Relative: 13 %
Neutro Abs: 2.7 K/uL (ref 1.7–7.7)
Neutrophils Relative %: 48 %
Platelet Count: 122 K/uL — ABNORMAL LOW (ref 150–400)
RBC: 5.14 MIL/uL — ABNORMAL HIGH (ref 3.87–5.11)
RDW: 19.9 % — ABNORMAL HIGH (ref 11.5–15.5)
WBC Count: 5.6 K/uL (ref 4.0–10.5)
nRBC: 0 % (ref 0.0–0.2)

## 2024-05-15 LAB — MAGNESIUM: Magnesium: 1.8 mg/dL (ref 1.7–2.4)

## 2024-05-15 MED ORDER — SODIUM CHLORIDE 0.9 % IV SOLN
6.0000 mg/kg | Freq: Once | INTRAVENOUS | Status: AC
  Administered 2024-05-15: 800 mg via INTRAVENOUS
  Filled 2024-05-15: qty 40

## 2024-05-15 MED ORDER — FLUOROURACIL CHEMO INJECTION 2.5 GM/50ML
400.0000 mg/m2 | Freq: Once | INTRAVENOUS | Status: AC
  Administered 2024-05-15: 1000 mg via INTRAVENOUS
  Filled 2024-05-15: qty 20

## 2024-05-15 MED ORDER — DEXAMETHASONE SOD PHOSPHATE PF 10 MG/ML IJ SOLN
10.0000 mg | Freq: Once | INTRAMUSCULAR | Status: AC
  Administered 2024-05-15: 10 mg via INTRAVENOUS
  Filled 2024-05-15: qty 1

## 2024-05-15 MED ORDER — OXALIPLATIN CHEMO INJECTION 100 MG/20ML
65.0000 mg/m2 | Freq: Once | INTRAVENOUS | Status: AC
  Administered 2024-05-15: 170 mg via INTRAVENOUS
  Filled 2024-05-15: qty 34

## 2024-05-15 MED ORDER — SODIUM CHLORIDE 0.9 % IV SOLN
INTRAVENOUS | Status: DC
  Filled 2024-05-15: qty 250

## 2024-05-15 MED ORDER — DEXTROSE 5 % IV SOLN
INTRAVENOUS | Status: DC
  Filled 2024-05-15: qty 250

## 2024-05-15 MED ORDER — SODIUM CHLORIDE 0.9 % IV SOLN
2400.0000 mg/m2 | INTRAVENOUS | Status: DC
  Administered 2024-05-15: 7000 mg via INTRAVENOUS
  Filled 2024-05-15: qty 140

## 2024-05-15 MED ORDER — PALONOSETRON HCL INJECTION 0.25 MG/5ML
0.2500 mg | Freq: Once | INTRAVENOUS | Status: AC
  Administered 2024-05-15: 0.25 mg via INTRAVENOUS
  Filled 2024-05-15: qty 5

## 2024-05-15 MED ORDER — LEUCOVORIN CALCIUM INJECTION 350 MG
400.0000 mg/m2 | Freq: Once | INTRAVENOUS | Status: AC
  Administered 2024-05-15: 1056 mg via INTRAVENOUS
  Filled 2024-05-15: qty 50

## 2024-05-15 NOTE — Progress Notes (Signed)
 Patient has some concerns to address during today's visit.

## 2024-05-15 NOTE — Progress Notes (Signed)
 "    Hematology/Oncology Consult note [facility]  [phone] [phone]  Patient Care Team: Patient, No Pcp Per as PCP - General (General Practice) Maurie Rayfield BIRCH, RN as Oncology Nurse Navigator Melanee Annah BROCKS, MD as Consulting Physician (Oncology)   Name of the patient: Melissa Johns  [id number]  1965-05-06   Date of visit: 05/15/2024  Diagnosis-  Cancer Staging  Colorectal cancer, stage IV [facility]) Staging form: Colon and Rectum, AJCC 8th Edition - Clinical stage from 02/23/2024: Stage IVC (cTX, cN2b, pM1c) - Signed by Melanee Annah BROCKS, MD on 02/25/2024    Chief complaint/ Reason for visit-on treatment assessment prior to cycle 6 of palliative FOLFOX panitumumab  chemotherapy  Heme/Onc history:  patient is a 59 year old female with a past medical history significant for hypertension who presented with symptoms of epigastric and right upper quadrant abdominal pain which has been ongoing for 2 weeks.She underwent CT abdomen pelvis with contrast in the ER which showed significant thoracic adenopathy, multiple hepatic masses.  Left greater than right adrenal nodules.  There was evidence of intra-abdominal adenopathy including index gastrohepatic lymph node measuring 2.2 cm.  Extensive adenopathy within the sigmoid mesocolon with a nodal mass of 3.8 x 4.2 cm.  Findings concerning for rectosigmoid primary given the eccentric right wall thickening.   CT angio chest did not show any evidence of large central pulmonary embolus or subsegmental pulmonary embolism.  Bulky mediastinal and bilateral hilar adenopathy. Ultrasound-guided liver biopsy showed metastatic moderately differentiated adenocarcinoma consistent with colorectal primary.  Focal CK7 positive bile ducts.  Cells positive for CK20 and CDX2 and negative for CK7 GATA3 PAX8 and TTF-1.  Tempus testing on the specimen showed negative for KRAS and NRAS.  APC, T p53 and SMAD4 mutation.  DYPD normal.  MSI  stable.  HER2 equivocal by IHC and negative by FISH.   Patient was started on palliative FOLFOX chemotherapy on 03/01/2024.  Panitumumab  started with cycle 3.     Interval history-overall patient is tolerating chemotherapy well.  No significant skin rash and she is on prophylactic doxycycline .  She does report some dry hands especially over her fingertips which can be chapped at times.  Occasional nosebleeds.  Sleep is better with trazodone  but she reports sleeping well every other day  ECOG PS- 1 Pain scale- 0 Opioid associated constipation- no  Review of systems- Review of Systems  Constitutional:  Positive for malaise/fatigue. Negative for chills, fever and weight loss.  HENT:  Positive for nosebleeds. Negative for congestion and ear discharge.   Eyes:  Negative for blurred vision.  Respiratory:  Negative for cough, hemoptysis, sputum production, shortness of breath and wheezing.   Cardiovascular:  Negative for chest pain, palpitations, orthopnea and claudication.  Gastrointestinal:  Negative for abdominal pain, blood in stool, constipation, diarrhea, heartburn, melena, nausea and vomiting.  Genitourinary:  Negative for dysuria, flank pain, frequency, hematuria and urgency.  Musculoskeletal:  Negative for back pain, joint pain and myalgias.  Skin:  Negative for rash.  Neurological:  Negative for dizziness, tingling, focal weakness, seizures, weakness and headaches.  Endo/Heme/Allergies:  Does not bruise/bleed easily.  Psychiatric/Behavioral:  Negative for depression and suicidal ideas. The patient does not have insomnia.       Allergies[1]   Past Medical History:  Diagnosis Date   Ectopic pregnancy    Hypertension    Miscarriage      Past Surgical History:  Procedure Laterality Date   CYST REMOVAL HAND Right    IR IMAGING GUIDED  PORT INSERTION  02/27/2024   LIVER BIOPSY     SALPINGECTOMY     Etopic Pregnancy    Social History   Socioeconomic History   Marital  status: Married    Spouse name: Ozell   Number of children: 3   Years of education: Not on file   Highest education level: Not on file  Occupational History   Not on file  Tobacco Use   Smoking status: Former    Types: Cigarettes   Smokeless tobacco: Never  Vaping Use   Vaping status: Former   Quit date: 02/13/2024  Substance and Sexual Activity   Alcohol use: No   Drug use: No   Sexual activity: Yes  Other Topics Concern   Not on file  Social History Narrative   Not on file   Social Drivers of Health   Tobacco Use: Medium Risk (05/15/2024)   Patient History    Smoking Tobacco Use: Former    Smokeless Tobacco Use: Never    Passive Exposure: Not on Actuary Strain: Not on file  Food Insecurity: No Food Insecurity (02/23/2024)   Epic    Worried About Programme Researcher, Broadcasting/film/video in the Last Year: Never true    Ran Out of Food in the Last Year: Never true  Transportation Needs: No Transportation Needs (02/23/2024)   Epic    Lack of Transportation (Medical): No    Lack of Transportation (Non-Medical): No  Physical Activity: Not on file  Stress: Not on file  Social Connections: Not on file  Intimate Partner Violence: Not At Risk (02/23/2024)   Epic    Fear of Current or Ex-Partner: No    Emotionally Abused: No    Physically Abused: No    Sexually Abused: No  Depression (PHQ2-9): Low Risk (05/15/2024)   Depression (PHQ2-9)    PHQ-2 Score: 0  Alcohol Screen: Not on file  Housing: Low Risk (02/23/2024)   Epic    Unable to Pay for Housing in the Last Year: No    Number of Times Moved in the Last Year: 0    Homeless in the Last Year: No  Utilities: Not At Risk (02/23/2024)   Epic    Threatened with loss of utilities: No  Health Literacy: Not on file    Family History  Problem Relation Age of Onset   Aneurysm Mother    Hypertension Mother    Heart Problems Father    Breast cancer Sister 23   COPD Half-Sister     Current Medications[2]  Physical  exam:  Vitals:   05/15/24 0904  BP: 124/75  Pulse: 99  Resp: 19  Temp: 97.6 F (36.4 C)  TempSrc: Tympanic  SpO2: 96%  Weight: (!) 328 lb 11.2 oz (149.1 kg)  Height: 5' 10 (1.778 m)   Physical Exam Cardiovascular:     Rate and Rhythm: Normal rate and regular rhythm.     Heart sounds: Normal heart sounds.  Pulmonary:     Effort: Pulmonary effort is normal.     Breath sounds: Normal breath sounds.  Skin:    General: Skin is warm and dry.  Neurological:     Mental Status: She is alert and oriented to person, place, and time.      I have personally reviewed labs listed below:    Latest Ref Rng & Units 05/15/2024    8:34 AM  CMP  Glucose 70 - 99 mg/dL 875   BUN 6 - 20 mg/dL 18  Creatinine 0.44 - 1.00 mg/dL 9.35   Sodium 864 - 854 mmol/L 139   Potassium 3.5 - 5.1 mmol/L 3.8   Chloride 98 - 111 mmol/L 100   CO2 22 - 32 mmol/L 27   Calcium  8.9 - 10.3 mg/dL 89.9   Total Protein 6.5 - 8.1 g/dL 7.0   Total Bilirubin 0.0 - 1.2 mg/dL 0.6   Alkaline Phos 38 - 126 U/L 94   AST 15 - 41 U/L 42   ALT 0 - 44 U/L 20       Latest Ref Rng & Units 05/15/2024    8:34 AM  CBC  WBC 4.0 - 10.5 K/uL 5.6   Hemoglobin 12.0 - 15.0 g/dL 85.6   Hematocrit 63.9 - 46.0 % 43.3   Platelets 150 - 400 K/uL 122      Assessment and plan- Patient is a 59 y.o. female with history of metastatic colon adenocarcinoma here for on treatment assessment prior to cycle 6 of palliative FOLFOX chemotherapy along with panitumumab   Counts okay to proceed with cycle 6 of FOLFOX panitumumab  chemotherapy today.  Repeat CT scan scheduled for next week.  CEA overall trending down.  Chemo-induced peripheral neuropathy: Continue gabapentin . 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 Colorectal cancer, stage IV (HCC)   2. Encounter for antineoplastic  chemotherapy   3. Encounter for monoclonal antibody treatment for malignancy   4. Other insomnia      Dr. Annah Skene, MD, MPH [facility] [phone] 05/15/2024 12:45 PM                   [1]  Allergies Allergen Reactions   Penicillins Other (See Comments)    joints lock up  Joints lock becomes immobile, joint pain  [2]  Current Outpatient Medications:    doxycycline  (VIBRA -TABS) 100 MG tablet, Take 1 tablet (100 mg total) by mouth 2 (two) times daily., Disp: 60 tablet, Rfl: 1   fentaNYL  (DURAGESIC ) 12 MCG/HR, Place 1 patch onto the skin every 3 (three) days., Disp: 10 patch, Rfl: 0   gabapentin  (NEURONTIN ) 300 MG capsule, Take 1 capsule (300 mg total) by mouth 3 (three) times daily., Disp: 90 capsule, Rfl: 1   hydrocortisone  cream 1 %, Apply 1 Application topically 2 (two) times daily., Disp: 28 g, Rfl: 1   HYDROmorphone  (DILAUDID ) 2 MG tablet, Take 1 tablet (2 mg total) by mouth every 3 (three) hours., Disp: 90 tablet, Rfl: 0   lisinopril -hydrochlorothiazide  (ZESTORETIC ) 20-12.5 MG tablet, Take 1 tablet by mouth daily., Disp: 30 tablet, Rfl: 1   ondansetron  (ZOFRAN ) 8 MG tablet, Take 1 tablet (8 mg total) by mouth every 8 (eight) hours as needed for nausea or vomiting. Start on the third day after chemotherapy., Disp: 30 tablet, Rfl: 1   prochlorperazine  (COMPAZINE ) 10 MG tablet, Take 1 tablet (10 mg total) by mouth every 6 (six) hours as needed for nausea or vomiting., Disp: 30 tablet, Rfl: 1   traZODone  (DESYREL ) 100 MG tablet, Take 1 tablet (100 mg total) by mouth at bedtime., Disp: 30 tablet, Rfl: 1   gabapentin  (NEURONTIN ) 100 MG capsule, Take 1 capsule (100 mg total) by mouth at bedtime., Disp: 30 capsule, Rfl: 0   HYDROcodone  bit-homatropine (HYCODAN) 5-1.5 MG/5ML syrup, Take 5 mLs by mouth every 6 (six) hours as needed for cough. (Patient not taking: Reported on  05/01/2024), Disp: 120 mL, Rfl: 0   ondansetron  (ZOFRAN ) 8 MG tablet,  Take by mouth., Disp: , Rfl:    polyethylene glycol (MIRALAX ) 17 g packet, Take 17 g by mouth 2 (two) times daily., Disp: 180 packet, Rfl: 0   prochlorperazine  (COMPAZINE ) 10 MG tablet, Take 10 mg by mouth every 6 (six) hours as needed., Disp: , Rfl:    traZODone  (DESYREL ) 50 MG tablet, Take 1 tablet (50 mg total) by mouth at bedtime as needed for sleep., Disp: 30 tablet, Rfl: 0 No current facility-administered medications for this visit.  Facility-Administered Medications Ordered in Other Visits:    0.9 %  sodium chloride  infusion, , Intravenous, Continuous, Melanee Annah BROCKS, MD, Last Rate: 10 mL/hr at 05/15/24 0954, New Bag at 05/15/24 0954   dextrose  5 % solution, , Intravenous, Continuous, Melanee Annah BROCKS, MD, Last Rate: 10 mL/hr at 05/15/24 1139, New Bag at 05/15/24 1139   fluorouracil  (ADRUCIL ) 7,000 mg in sodium chloride  0.9 % 110 mL chemo infusion, 2,400 mg/m2 (Treatment Plan Recorded), Intravenous, 1 day or 1 dose, Melanee Annah BROCKS, MD   fluorouracil  (ADRUCIL ) chemo injection 1,000 mg, 400 mg/m2 (Treatment Plan Recorded), Intravenous, Once, Melanee Annah BROCKS, MD   leucovorin  1,056 mg in dextrose  5 % 250 mL infusion, 400 mg/m2 (Treatment Plan Recorded), Intravenous, Once, Melanee Annah BROCKS, MD, Last Rate: 151 mL/hr at 05/15/24 1140, 1,056 mg at 05/15/24 1140   oxaliplatin  (ELOXATIN ) 170 mg in dextrose  5 % 500 mL chemo infusion, 65 mg/m2 (Treatment Plan Recorded), Intravenous, Once, Melanee Annah BROCKS, MD, Last Rate: 267 mL/hr at 05/15/24 1141, 170 mg at 05/15/24 1141  "

## 2024-05-15 NOTE — Patient Instructions (Signed)
 [facility] - A DEPT OF [facility]  Discharge Instructions: Thank you for choosing [facility] to provide your oncology and hematology care.  If you have a lab appointment with the Cancer Center, please go directly to the Cancer Center and check in at the registration area.  Wear comfortable clothing and clothing appropriate for easy access to any Portacath or PICC line.   We strive to give you quality time with your provider. You may need to reschedule your appointment if you arrive late (15 or more minutes).  Arriving late affects you and other patients whose appointments are after yours.  Also, if you miss three or more appointments without notifying the office, you may be dismissed from the clinic at the providers discretion.      For prescription refill requests, have your pharmacy contact our office and allow 72 hours for refills to be completed.    Today you received the following chemotherapy and/or immunotherapy agents VECTIBEX. OXALIPLATIN , LEUCOVORIN , 5FU      To help prevent nausea and vomiting after your treatment, we encourage you to take your nausea medication as directed.  BELOW ARE SYMPTOMS THAT SHOULD BE REPORTED IMMEDIATELY: *FEVER GREATER THAN 100.4 F (38 C) OR HIGHER *CHILLS OR SWEATING *NAUSEA AND VOMITING THAT IS NOT CONTROLLED WITH YOUR NAUSEA MEDICATION *UNUSUAL SHORTNESS OF BREATH *UNUSUAL BRUISING OR BLEEDING *URINARY PROBLEMS (pain or burning when urinating, or frequent urination) *BOWEL PROBLEMS (unusual diarrhea, constipation, pain near the anus) TENDERNESS IN MOUTH AND THROAT WITH OR WITHOUT PRESENCE OF ULCERS (sore throat, sores in mouth, or a toothache) UNUSUAL RASH, SWELLING OR PAIN  UNUSUAL VAGINAL DISCHARGE OR ITCHING   Items with * indicate a potential emergency and should be followed up as soon as possible or go to the Emergency Department if any problems should occur.  Please show the CHEMOTHERAPY  ALERT CARD or IMMUNOTHERAPY ALERT CARD at check-in to the Emergency Department and triage nurse.  Should you have questions after your visit or need to cancel or reschedule your appointment, please contact [facility] - A DEPT OF [name]  [phone] and follow the prompts.  Office hours are 8:00 a.m. to 4:30 p.m. Monday - Friday. Please note that voicemails left after 4:00 p.m. may not be returned until the following business day.  We are closed weekends and major holidays. You have access to a nurse at all times for urgent questions. Please call the main number to the clinic [phone] and follow the prompts.  For any non-urgent questions, you may also contact your provider using MyChart. We now offer e-Visits for anyone 54 and older to request care online for non-urgent symptoms. For details visit [email or web address].   Also download the MyChart app! Go to the app store, search MyChart, open the app, select [facility], and log in with your MyChart username and password.  Panitumumab  Injection What is this medication? PANITUMUMAB  (pan i TOOM ue mab) treats colorectal cancer. It works by blocking a protein that causes cancer cells to grow and multiply. This helps to slow or stop the spread of cancer cells. It is a monoclonal antibody. This medicine may be used for other purposes; ask your health care provider or pharmacist if you have questions. COMMON BRAND NAME(S): Vectibix  What should I tell my care team before I take this medication? They need to know if you have any of these conditions: Eye disease Low levels  of magnesium in the blood Lung disease An unusual or allergic reaction to panitumumab , other medications, foods, dyes, or preservatives Pregnant or trying to get pregnant Breast-feeding How should I use this medication? This medication is injected into a vein. It is given by your care team in a hospital or clinic setting. Talk to your  care team about the use of this medication in children. Special care may be needed. Overdosage: If you think you have taken too much of this medicine contact a poison control center or emergency room at once. NOTE: This medicine is only for you. Do not share this medicine with others. What if I miss a dose? Keep appointments for follow-up doses. It is important not to miss your dose. Call your care team if you are unable to keep an appointment. What may interact with this medication? Bevacizumab This list may not describe all possible interactions. Give your health care provider a list of all the medicines, herbs, non-prescription drugs, or dietary supplements you use. Also tell them if you smoke, drink alcohol, or use illegal drugs. Some items may interact with your medicine. What should I watch for while using this medication? Your condition will be monitored carefully while you are receiving this medication. This medication may make you feel generally unwell. This is not uncommon as chemotherapy can affect healthy cells as well as cancer cells. Report any side effects. Continue your course of treatment even though you feel ill unless your care team tells you to stop. This medication can make you more sensitive to the sun. Keep out of the sun while receiving this medication and for 2 months after stopping therapy. If you cannot avoid being in the sun, wear protective clothing and sunscreen. Do not use sun lamps, tanning beds, or tanning booths. Check with your care team if you have severe diarrhea, nausea, and vomiting or if you sweat a lot. The loss of too much body fluid may make it dangerous for you to take this medication. This medication may cause serious skin reactions. They can happen weeks to months after starting the medication. Contact your care team right away if you notice fevers or flu-like symptoms with a rash. The rash may be red or purple and then turn into blisters or peeling of the  skin. You may also notice a red rash with swelling of the face, lips, or lymph nodes in your neck or under your arms. Talk to your care team if you may be pregnant. Serious birth defects can occur if you take this medication during pregnancy and for 2 months after the last dose. Contraception is recommended while taking this medication and for 2 months after the last dose. Your care team can help you find the option that works for you. Do not breastfeed while taking this medication and for 2 months after the last dose. This medication may cause infertility. Talk to your care team if you are concerned about your fertility. What side effects may I notice from receiving this medication? Side effects that you should report to your care team as soon as possible: Allergic reactions--skin rash, itching, hives, swelling of the face, lips, tongue, or throat Dry cough, shortness of breath or trouble breathing Eye pain, redness, irritation, or discharge with blurry or decreased vision Infusion reactions--chest pain, shortness of breath or trouble breathing, feeling faint or lightheaded Low magnesium level--muscle pain or cramps, unusual weakness or fatigue, fast or irregular heartbeat, tremors Low potassium level--muscle pain or cramps, unusual weakness or  fatigue, fast or irregular heartbeat, constipation Redness, blistering, peeling, or loosening of the skin, including inside the mouth Skin reactions on sun-exposed areas Side effects that usually do not require medical attention (report to your care team if they continue or are bothersome): Change in nail shape, thickness, or color Diarrhea Dry skin Fatigue Nausea Vomiting This list may not describe all possible side effects. Call your doctor for medical advice about side effects. You may report side effects to FDA at 1-800-FDA-1088. Where should I keep my medication? This medication is given in a hospital or clinic. It will not be stored at  home. NOTE: This sheet is a summary. It may not cover all possible information. If you have questions about this medicine, talk to your doctor, pharmacist, or health care provider.  2024 Elsevier/Gold Standard (2021-09-01 00:00:00)  Oxaliplatin  Injection What is this medication? OXALIPLATIN  (ox AL i PLA tin) treats colorectal cancer. It works by slowing down the growth of cancer cells. This medicine may be used for other purposes; ask your health care provider or pharmacist if you have questions. COMMON BRAND NAME(S): Eloxatin  What should I tell my care team before I take this medication? They need to know if you have any of these conditions: Heart disease History of irregular heartbeat or rhythm Liver disease Low blood cell levels (white cells, red cells, and platelets) Lung or breathing disease, such as asthma Take medications that treat or prevent blood clots Tingling of the fingers, toes, or other nerve disorder An unusual or allergic reaction to oxaliplatin , other medications, foods, dyes, or preservatives If you or your partner are pregnant or trying to get pregnant Breast-feeding How should I use this medication? This medication is injected into a vein. It is given by your care team in a hospital or clinic setting. Talk to your care team about the use of this medication in children. Special care may be needed. Overdosage: If you think you have taken too much of this medicine contact a poison control center or emergency room at once. NOTE: This medicine is only for you. Do not share this medicine with others. What if I miss a dose? Keep appointments for follow-up doses. It is important not to miss a dose. Call your care team if you are unable to keep an appointment. What may interact with this medication? Do not take this medication with any of the following: Cisapride Dronedarone Pimozide Thioridazine This medication may also interact with the following: Aspirin and  aspirin-like medications Certain medications that treat or prevent blood clots, such as warfarin, apixaban, dabigatran, and rivaroxaban Cisplatin Cyclosporine Diuretics Medications for infection, such as acyclovir, adefovir, amphotericin B, bacitracin, cidofovir, foscarnet, ganciclovir, gentamicin, pentamidine, vancomycin NSAIDs, medications for pain and inflammation, such as ibuprofen or naproxen Other medications that cause heart rhythm changes Pamidronate Zoledronic acid This list may not describe all possible interactions. Give your health care provider a list of all the medicines, herbs, non-prescription drugs, or dietary supplements you use. Also tell them if you smoke, drink alcohol, or use illegal drugs. Some items may interact with your medicine. What should I watch for while using this medication? Your condition will be monitored carefully while you are receiving this medication. You may need blood work while taking this medication. This medication may make you feel generally unwell. This is not uncommon as chemotherapy can affect healthy cells as well as cancer cells. Report any side effects. Continue your course of treatment even though you feel ill unless your care team tells  you to stop. This medication may increase your risk of getting an infection. Call your care team for advice if you get a fever, chills, sore throat, or other symptoms of a cold or flu. Do not treat yourself. Try to avoid being around people who are sick. Avoid taking medications that contain aspirin, acetaminophen , ibuprofen, naproxen, or ketoprofen unless instructed by your care team. These medications may hide a fever. Be careful brushing or flossing your teeth or using a toothpick because you may get an infection or bleed more easily. If you have any dental work done, tell your dentist you are receiving this medication. This medication can make you more sensitive to cold. Do not drink cold drinks or use ice.  Cover exposed skin before coming in contact with cold temperatures or cold objects. When out in cold weather wear warm clothing and cover your mouth and nose to warm the air that goes into your lungs. Tell your care team if you get sensitive to the cold. Talk to your care team if you or your partner are pregnant or think either of you might be pregnant. This medication can cause serious birth defects if taken during pregnancy and for 9 months after the last dose. A negative pregnancy test is required before starting this medication. A reliable form of contraception is recommended while taking this medication and for 9 months after the last dose. Talk to your care team about effective forms of contraception. Do not father a child while taking this medication and for 6 months after the last dose. Use a condom while having sex during this time period. Do not breastfeed while taking this medication and for 3 months after the last dose. This medication may cause infertility. Talk to your care team if you are concerned about your fertility. What side effects may I notice from receiving this medication? Side effects that you should report to your care team as soon as possible: Allergic reactions--skin rash, itching, hives, swelling of the face, lips, tongue, or throat Bleeding--bloody or black, tar-like stools, vomiting blood or brown material that looks like coffee grounds, red or dark brown urine, small red or purple spots on skin, unusual bruising or bleeding Dry cough, shortness of breath or trouble breathing Heart rhythm changes--fast or irregular heartbeat, dizziness, feeling faint or lightheaded, chest pain, trouble breathing Infection--fever, chills, cough, sore throat, wounds that don't heal, pain or trouble when passing urine, general feeling of discomfort or being unwell Liver injury--right upper belly pain, loss of appetite, nausea, light-colored stool, dark yellow or brown urine, yellowing skin or  eyes, unusual weakness or fatigue Low red blood cell level--unusual weakness or fatigue, dizziness, headache, trouble breathing Muscle injury--unusual weakness or fatigue, muscle pain, dark yellow or brown urine, decrease in amount of urine Pain, tingling, or numbness in the hands or feet Sudden and severe headache, confusion, change in vision, seizures, which may be signs of posterior reversible encephalopathy syndrome (PRES) Unusual bruising or bleeding Side effects that usually do not require medical attention (report to your care team if they continue or are bothersome): Diarrhea Nausea Pain, redness, or swelling with sores inside the mouth or throat Unusual weakness or fatigue Vomiting This list may not describe all possible side effects. Call your doctor for medical advice about side effects. You may report side effects to FDA at 1-800-FDA-1088. Where should I keep my medication? This medication is given in a hospital or clinic. It will not be stored at home. NOTE: This sheet is a  summary. It may not cover all possible information. If you have questions about this medicine, talk to your doctor, pharmacist, or health care provider.  2024 Elsevier/Gold Standard (2023-03-31 00:00:00)  Leucovorin  Injection What is this medication? LEUCOVORIN  (loo koe VOR in) prevents side effects from certain medications, such as methotrexate. It works by increasing folate levels. This helps protect healthy cells in your body. It may also be used to treat anemia caused by low levels of folate. It can also be used with fluorouracil , a type of chemotherapy, to treat colorectal cancer. It works by increasing the effects of fluorouracil  in the body. This medicine may be used for other purposes; ask your health care provider or pharmacist if you have questions. What should I tell my care team before I take this medication? They need to know if you have any of these conditions: Anemia from low levels of vitamin  B12 in the blood An unusual or allergic reaction to leucovorin , folic acid, other medications, foods, dyes, or preservatives Pregnant or trying to get pregnant Breastfeeding How should I use this medication? This medication is injected into a vein or a muscle. It is given by your care team in a hospital or clinic setting. Talk to your care team about the use of this medication in children. Special care may be needed. Overdosage: If you think you have taken too much of this medicine contact a poison control center or emergency room at once. NOTE: This medicine is only for you. Do not share this medicine with others. What if I miss a dose? Keep appointments for follow-up doses. It is important not to miss your dose. Call your care team if you are unable to keep an appointment. What may interact with this medication? Capecitabine Fluorouracil  Phenobarbital Phenytoin Primidone Trimethoprim;sulfamethoxazole This list may not describe all possible interactions. Give your health care provider a list of all the medicines, herbs, non-prescription drugs, or dietary supplements you use. Also tell them if you smoke, drink alcohol, or use illegal drugs. Some items may interact with your medicine. What should I watch for while using this medication? Your condition will be monitored carefully while you are receiving this medication. This medication may increase the side effects of 5-fluorouracil . Tell your care team if you have diarrhea or mouth sores that do not get better or that get worse. What side effects may I notice from receiving this medication? Side effects that you should report to your care team as soon as possible: Allergic reactions--skin rash, itching, hives, swelling of the face, lips, tongue, or throat This list may not describe all possible side effects. Call your doctor for medical advice about side effects. You may report side effects to FDA at 1-800-FDA-1088. Where should I keep my  medication? This medication is given in a hospital or clinic. It will not be stored at home. NOTE: This sheet is a summary. It may not cover all possible information. If you have questions about this medicine, talk to your doctor, pharmacist, or health care provider.  2024 Elsevier/Gold Standard (2021-09-21 00:00:00)  Fluorouracil  Injection What is this medication? FLUOROURACIL  (flure oh YOOR a sil) treats some types of cancer. It works by slowing down the growth of cancer cells. This medicine may be used for other purposes; ask your health care provider or pharmacist if you have questions. COMMON BRAND NAME(S): Adrucil  What should I tell my care team before I take this medication? They need to know if you have any of these conditions: Blood disorders  Dihydropyrimidine dehydrogenase (DPD) deficiency Infection, such as chickenpox, cold sores, herpes Kidney disease Liver disease Poor nutrition Recent or ongoing radiation therapy An unusual or allergic reaction to fluorouracil , other medications, foods, dyes, or preservatives If you or your partner are pregnant or trying to get pregnant Breast-feeding How should I use this medication? This medication is injected into a vein. It is administered by your care team in a hospital or clinic setting. Talk to your care team about the use of this medication in children. Special care may be needed. Overdosage: If you think you have taken too much of this medicine contact a poison control center or emergency room at once. NOTE: This medicine is only for you. Do not share this medicine with others. What if I miss a dose? Keep appointments for follow-up doses. It is important not to miss your dose. Call your care team if you are unable to keep an appointment. What may interact with this medication? Do not take this medication with any of the following: Live virus vaccines This medication may also interact with the following: Medications that treat  or prevent blood clots, such as warfarin, enoxaparin, dalteparin This list may not describe all possible interactions. Give your health care provider a list of all the medicines, herbs, non-prescription drugs, or dietary supplements you use. Also tell them if you smoke, drink alcohol, or use illegal drugs. Some items may interact with your medicine. What should I watch for while using this medication? Your condition will be monitored carefully while you are receiving this medication. This medication may make you feel generally unwell. This is not uncommon as chemotherapy can affect healthy cells as well as cancer cells. Report any side effects. Continue your course of treatment even though you feel ill unless your care team tells you to stop. In some cases, you may be given additional medications to help with side effects. Follow all directions for their use. This medication may increase your risk of getting an infection. Call your care team for advice if you get a fever, chills, sore throat, or other symptoms of a cold or flu. Do not treat yourself. Try to avoid being around people who are sick. This medication may increase your risk to bruise or bleed. Call your care team if you notice any unusual bleeding. Be careful brushing or flossing your teeth or using a toothpick because you may get an infection or bleed more easily. If you have any dental work done, tell your dentist you are receiving this medication. Avoid taking medications that contain aspirin, acetaminophen , ibuprofen, naproxen, or ketoprofen unless instructed by your care team. These medications may hide a fever. Do not treat diarrhea with over the counter products. Contact your care team if you have diarrhea that lasts more than 2 days or if it is severe and watery. This medication can make you more sensitive to the sun. Keep out of the sun. If you cannot avoid being in the sun, wear protective clothing and sunscreen. Do not use sun lamps,  tanning beds, or tanning booths. Talk to your care team if you or your partner wish to become pregnant or think you might be pregnant. This medication can cause serious birth defects if taken during pregnancy and for 3 months after the last dose. A reliable form of contraception is recommended while taking this medication and for 3 months after the last dose. Talk to your care team about effective forms of contraception. Do not father a child while taking this  medication and for 3 months after the last dose. Use a condom while having sex during this time period. Do not breastfeed while taking this medication. This medication may cause infertility. Talk to your care team if you are concerned about your fertility. What side effects may I notice from receiving this medication? Side effects that you should report to your care team as soon as possible: Allergic reactions--skin rash, itching, hives, swelling of the face, lips, tongue, or throat Heart attack--pain or tightness in the chest, shoulders, arms, or jaw, nausea, shortness of breath, cold or clammy skin, feeling faint or lightheaded Heart failure--shortness of breath, swelling of the ankles, feet, or hands, sudden weight gain, unusual weakness or fatigue Heart rhythm changes--fast or irregular heartbeat, dizziness, feeling faint or lightheaded, chest pain, trouble breathing High ammonia level--unusual weakness or fatigue, confusion, loss of appetite, nausea, vomiting, seizures Infection--fever, chills, cough, sore throat, wounds that don't heal, pain or trouble when passing urine, general feeling of discomfort or being unwell Low red blood cell level--unusual weakness or fatigue, dizziness, headache, trouble breathing Pain, tingling, or numbness in the hands or feet, muscle weakness, change in vision, confusion or trouble speaking, loss of balance or coordination, trouble walking, seizures Redness, swelling, and blistering of the skin over hands and  feet Severe or prolonged diarrhea Unusual bruising or bleeding Side effects that usually do not require medical attention (report to your care team if they continue or are bothersome): Dry skin Headache Increased tears Nausea Pain, redness, or swelling with sores inside the mouth or throat Sensitivity to light Vomiting This list may not describe all possible side effects. Call your doctor for medical advice about side effects. You may report side effects to FDA at 1-800-FDA-1088. Where should I keep my medication? This medication is given in a hospital or clinic. It will not be stored at home. NOTE: This sheet is a summary. It may not cover all possible information. If you have questions about this medicine, talk to your doctor, pharmacist, or health care provider.  2024 Elsevier/Gold Standard (2021-08-24 00:00:00)

## 2024-05-16 ENCOUNTER — Telehealth: Payer: Self-pay | Admitting: Oncology

## 2024-05-16 LAB — CEA: CEA: 54.3 ng/mL — ABNORMAL HIGH (ref 0.0–4.7)

## 2024-05-16 NOTE — Telephone Encounter (Signed)
 Called pt to confirm CT appt date/time/location - left vm w/appt info - asked pt to call back if she needed to change appt date/time/location - [facility]

## 2024-05-17 ENCOUNTER — Inpatient Hospital Stay

## 2024-05-22 ENCOUNTER — Ambulatory Visit
Admission: RE | Admit: 2024-05-22 | Discharge: 2024-05-22 | Disposition: A | Discharge status: Home / Self Care | Source: Ambulatory Visit | Attending: Oncology | Admitting: Oncology

## 2024-05-22 DIAGNOSIS — C19 Malignant neoplasm of rectosigmoid junction: Secondary | ICD-10-CM | POA: Diagnosis present

## 2024-05-22 MED ORDER — IOHEXOL 350 MG/ML SOLN
100.0000 mL | Freq: Once | INTRAVENOUS | Status: AC | PRN
  Administered 2024-05-22: 100 mL via INTRAVENOUS

## 2024-05-22 MED ORDER — BARIUM SULFATE 2 % PO SUSP
450.0000 mL | ORAL | Status: AC
  Administered 2024-05-22 (×2): 450 mL via ORAL

## 2024-05-29 ENCOUNTER — Inpatient Hospital Stay: Admitting: Oncology

## 2024-05-29 ENCOUNTER — Inpatient Hospital Stay

## 2024-05-29 ENCOUNTER — Encounter: Payer: Self-pay | Admitting: Oncology

## 2024-05-29 ENCOUNTER — Other Ambulatory Visit: Payer: Self-pay

## 2024-05-29 VITALS — BP 140/69 | HR 102

## 2024-05-29 VITALS — BP 133/73 | HR 73 | Temp 98.6°F | Resp 20 | Ht 70.0 in | Wt 331.7 lb

## 2024-05-29 DIAGNOSIS — C19 Malignant neoplasm of rectosigmoid junction: Secondary | ICD-10-CM

## 2024-05-29 DIAGNOSIS — T451X5A Adverse effect of antineoplastic and immunosuppressive drugs, initial encounter: Secondary | ICD-10-CM

## 2024-05-29 DIAGNOSIS — Z5111 Encounter for antineoplastic chemotherapy: Secondary | ICD-10-CM

## 2024-05-29 DIAGNOSIS — G620 Drug-induced polyneuropathy: Secondary | ICD-10-CM

## 2024-05-29 DIAGNOSIS — Z5112 Encounter for antineoplastic immunotherapy: Secondary | ICD-10-CM

## 2024-05-29 LAB — CBC WITH DIFFERENTIAL (CANCER CENTER ONLY)
Abs Immature Granulocytes: 0.01 10*3/uL (ref 0.00–0.07)
Basophils Absolute: 0 10*3/uL (ref 0.0–0.1)
Basophils Relative: 1 %
Eosinophils Absolute: 0.1 10*3/uL (ref 0.0–0.5)
Eosinophils Relative: 2 %
HCT: 42.8 % (ref 36.0–46.0)
Hemoglobin: 14.2 g/dL (ref 12.0–15.0)
Immature Granulocytes: 0 %
Lymphocytes Relative: 36 %
Lymphs Abs: 1.5 10*3/uL (ref 0.7–4.0)
MCH: 28.4 pg (ref 26.0–34.0)
MCHC: 33.2 g/dL (ref 30.0–36.0)
MCV: 85.6 fL (ref 80.0–100.0)
Monocytes Absolute: 0.6 10*3/uL (ref 0.1–1.0)
Monocytes Relative: 15 %
Neutro Abs: 1.9 10*3/uL (ref 1.7–7.7)
Neutrophils Relative %: 46 %
Platelet Count: 121 10*3/uL — ABNORMAL LOW (ref 150–400)
RBC: 5 MIL/uL (ref 3.87–5.11)
RDW: 19 % — ABNORMAL HIGH (ref 11.5–15.5)
WBC Count: 4.1 10*3/uL (ref 4.0–10.5)
nRBC: 0 % (ref 0.0–0.2)

## 2024-05-29 LAB — CMP (CANCER CENTER ONLY)
ALT: 23 U/L (ref 0–44)
AST: 46 U/L — ABNORMAL HIGH (ref 15–41)
Albumin: 3.9 g/dL (ref 3.5–5.0)
Alkaline Phosphatase: 89 U/L (ref 38–126)
Anion gap: 10 (ref 5–15)
BUN: 13 mg/dL (ref 6–20)
CO2: 27 mmol/L (ref 22–32)
Calcium: 9.6 mg/dL (ref 8.9–10.3)
Chloride: 102 mmol/L (ref 98–111)
Creatinine: 0.57 mg/dL (ref 0.44–1.00)
GFR, Estimated: >60 mL/min
Glucose, Bld: 112 mg/dL — ABNORMAL HIGH (ref 70–99)
Potassium: 3.9 mmol/L (ref 3.5–5.1)
Sodium: 139 mmol/L (ref 135–145)
Total Bilirubin: 0.5 mg/dL (ref 0.0–1.2)
Total Protein: 6.8 g/dL (ref 6.5–8.1)

## 2024-05-29 LAB — MAGNESIUM: Magnesium: 2 mg/dL (ref 1.7–2.4)

## 2024-05-29 MED ORDER — PALONOSETRON HCL INJECTION 0.25 MG/5ML
0.2500 mg | Freq: Once | INTRAVENOUS | Status: AC
  Administered 2024-05-29: 0.25 mg via INTRAVENOUS
  Filled 2024-05-29: qty 5

## 2024-05-29 MED ORDER — DEXTROSE 5 % IV SOLN
INTRAVENOUS | Status: DC
  Filled 2024-05-29: qty 250

## 2024-05-29 MED ORDER — OXALIPLATIN CHEMO INJECTION 100 MG/20ML
65.0000 mg/m2 | Freq: Once | INTRAVENOUS | Status: AC
  Administered 2024-05-29: 170 mg via INTRAVENOUS
  Filled 2024-05-29: qty 34

## 2024-05-29 MED ORDER — ONDANSETRON HCL 8 MG PO TABS
8.0000 mg | ORAL_TABLET | Freq: Three times a day (TID) | ORAL | 1 refills | Status: AC | PRN
  Filled 2024-05-29: qty 30, 10d supply, fill #0

## 2024-05-29 MED ORDER — PROCHLORPERAZINE MALEATE 10 MG PO TABS
10.0000 mg | ORAL_TABLET | Freq: Four times a day (QID) | ORAL | 1 refills | Status: AC | PRN
  Filled 2024-05-29: qty 30, 8d supply, fill #0

## 2024-05-29 MED ORDER — DEXAMETHASONE SOD PHOSPHATE PF 10 MG/ML IJ SOLN
10.0000 mg | Freq: Once | INTRAMUSCULAR | Status: AC
  Administered 2024-05-29: 10 mg via INTRAVENOUS
  Filled 2024-05-29: qty 1

## 2024-05-29 MED ORDER — FLUOROURACIL CHEMO INJECTION 2.5 GM/50ML
400.0000 mg/m2 | Freq: Once | INTRAVENOUS | Status: AC
  Administered 2024-05-29: 1000 mg via INTRAVENOUS
  Filled 2024-05-29: qty 20

## 2024-05-29 MED ORDER — SODIUM CHLORIDE 0.9 % IV SOLN
INTRAVENOUS | Status: DC
  Filled 2024-05-29: qty 250

## 2024-05-29 MED ORDER — SODIUM CHLORIDE 0.9 % IV SOLN
6.0000 mg/kg | Freq: Once | INTRAVENOUS | Status: AC
  Administered 2024-05-29: 800 mg via INTRAVENOUS
  Filled 2024-05-29: qty 40

## 2024-05-29 MED ORDER — SODIUM CHLORIDE 0.9 % IV SOLN
2400.0000 mg/m2 | INTRAVENOUS | Status: DC
  Administered 2024-05-29: 7000 mg via INTRAVENOUS
  Filled 2024-05-29: qty 140

## 2024-05-29 MED ORDER — LEUCOVORIN CALCIUM INJECTION 350 MG
400.0000 mg/m2 | Freq: Once | INTRAVENOUS | Status: AC
  Administered 2024-05-29: 1056 mg via INTRAVENOUS
  Filled 2024-05-29: qty 52.8
  Filled 2024-05-29: qty 50

## 2024-05-29 NOTE — Patient Instructions (Signed)
 [facility] - A DEPT OF [facility]  Discharge Instructions: Thank you for choosing [facility] to provide your oncology and hematology care.  If you have a lab appointment with the Cancer Center, please go directly to the Cancer Center and check in at the registration area.  Wear comfortable clothing and clothing appropriate for easy access to any Portacath or PICC line.   We strive to give you quality time with your provider. You may need to reschedule your appointment if you arrive late (15 or more minutes).  Arriving late affects you and other patients whose appointments are after yours.  Also, if you miss three or more appointments without notifying the office, you may be dismissed from the clinic at the providers discretion.      For prescription refill requests, have your pharmacy contact our office and allow 72 hours for refills to be completed.    Today you received the following chemotherapy and/or immunotherapy agents- vectibix , oxaliplatin , 5FU      To help prevent nausea and vomiting after your treatment, we encourage you to take your nausea medication as directed.  BELOW ARE SYMPTOMS THAT SHOULD BE REPORTED IMMEDIATELY: *FEVER GREATER THAN 100.4 F (38 C) OR HIGHER *CHILLS OR SWEATING *NAUSEA AND VOMITING THAT IS NOT CONTROLLED WITH YOUR NAUSEA MEDICATION *UNUSUAL SHORTNESS OF BREATH *UNUSUAL BRUISING OR BLEEDING *URINARY PROBLEMS (pain or burning when urinating, or frequent urination) *BOWEL PROBLEMS (unusual diarrhea, constipation, pain near the anus) TENDERNESS IN MOUTH AND THROAT WITH OR WITHOUT PRESENCE OF ULCERS (sore throat, sores in mouth, or a toothache) UNUSUAL RASH, SWELLING OR PAIN  UNUSUAL VAGINAL DISCHARGE OR ITCHING   Items with * indicate a potential emergency and should be followed up as soon as possible or go to the Emergency Department if any problems should occur.  Please show the CHEMOTHERAPY ALERT CARD  or IMMUNOTHERAPY ALERT CARD at check-in to the Emergency Department and triage nurse.  Should you have questions after your visit or need to cancel or reschedule your appointment, please contact [facility] - A DEPT OF [name]  [phone] and follow the prompts.  Office hours are 8:00 a.m. to 4:30 p.m. Monday - Friday. Please note that voicemails left after 4:00 p.m. may not be returned until the following business day.  We are closed weekends and major holidays. You have access to a nurse at all times for urgent questions. Please call the main number to the clinic [phone] and follow the prompts.  For any non-urgent questions, you may also contact your provider using MyChart. We now offer e-Visits for anyone 56 and older to request care online for non-urgent symptoms. For details visit [email or web address].   Also download the MyChart app! Go to the app store, search MyChart, open the app, select [place], and log in with your MyChart username and password.

## 2024-05-29 NOTE — Progress Notes (Signed)
 "    Hematology/Oncology Consult note [facility]  [phone] [phone]  Patient Care Team: Patient, No Pcp Per as PCP - General (General Practice) Maurie Rayfield BIRCH, RN as Oncology Nurse Navigator Melanee Annah BROCKS, MD as Consulting Physician (Oncology)   Name of the patient: Melissa Johns  [id number]  17-Aug-1965   Date of visit: 05/29/24  Diagnosis-  Cancer Staging  Colorectal cancer, stage IV [facility]) Staging form: Colon and Rectum, AJCC 8th Edition - Clinical stage from 02/23/2024: Stage IVC (cTX, cN2b, pM1c) - Signed by Melanee Annah BROCKS, MD on 02/25/2024    Chief complaint/ Reason for visit-on treatment assessment prior to cycle 7 of palliative FOLFOX panitumumab  chemotherapy  Heme/Onc history: patient is a 59 year old female with a past medical history significant for hypertension who presented with symptoms of epigastric and right upper quadrant abdominal pain which has been ongoing for 2 weeks.She underwent CT abdomen pelvis with contrast in the ER which showed significant thoracic adenopathy, multiple hepatic masses.  Left greater than right adrenal nodules.  There was evidence of intra-abdominal adenopathy including index gastrohepatic lymph node measuring 2.2 cm.  Extensive adenopathy within the sigmoid mesocolon with a nodal mass of 3.8 x 4.2 cm.  Findings concerning for rectosigmoid primary given the eccentric right wall thickening.   CT angio chest did not show any evidence of large central pulmonary embolus or subsegmental pulmonary embolism.  Bulky mediastinal and bilateral hilar adenopathy. Ultrasound-guided liver biopsy showed metastatic moderately differentiated adenocarcinoma consistent with colorectal primary.  Focal CK7 positive bile ducts.  Cells positive for CK20 and CDX2 and negative for CK7 GATA3 PAX8 and TTF-1.  Tempus testing on the specimen showed negative for KRAS and NRAS.  APC, T p53 and SMAD4 mutation.  DYPD normal.  MSI  stable.  HER2 equivocal by IHC and negative by FISH.   Patient was started on palliative FOLFOX chemotherapy on 03/01/2024.  Panitumumab  started with cycle 3.     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  She is currently undergoing her eighth cycle of FOLFOX (with oxaliplatin ) and Vectibix . She has completed seven prior cycles. Recent imaging demonstrates marked improvement in mediastinal lymphadenopathy, significant reduction in hepatic lesions, and near resolution of mesenteric soft tissue involvement. Adrenal nodules remain stable, and pulmonary nodules have decreased in size. No new lesions or progression were identified. Carcinoembryonic antigen levels have decreased. Laboratory studies show mild thrombocytopenia, hemoglobin at 14, and normal white cell and renal function. One liver enzyme remains mildly elevated.  She continues to tolerate Vectibix , though she experiences intermittent skin irritation and rash at the site of the clear barrier patch, which worsens while the patch is in place and improves after removal. Barrier creams have provided variable relief.  She discontinued the fentanyl  patch and now manages pain with Dilaudid , taking one tablet in the morning and one at night. She attempted to taper the morning dose but resumed after sustaining a fall in the bathroom, resulting in a large, sore contusion on her hip. She did not hit the floor and denies further injury.  Neuropathy symptoms persist, predominantly affecting her feet at night. She has consolidated gabapentin  dosing to 900 mg nightly, which also aids sleep. Symptoms worsen with prolonged immobility. Trazodone  is effective for insomnia. Nausea is managed with as-needed antiemetics, and she has requested refills for these medications.  She  is otherwise  feeling well between treatments and is managing side effects. She and her husband discussed scan results and requested access to imaging for family review.       ECOG PS- 1 Pain scale- 2 Opioid associated constipation- no  Review of systems- Review of Systems  Constitutional:  Negative for chills, fever, malaise/fatigue and weight loss.  HENT:  Negative for congestion, ear discharge and nosebleeds.   Eyes:  Negative for blurred vision.  Respiratory:  Negative for cough, hemoptysis, sputum production, shortness of breath and wheezing.   Cardiovascular:  Negative for chest pain, palpitations, orthopnea and claudication.  Gastrointestinal:  Negative for abdominal pain, blood in stool, constipation, diarrhea, heartburn, melena, nausea and vomiting.  Genitourinary:  Negative for dysuria, flank pain, frequency, hematuria and urgency.  Musculoskeletal:  Positive for back pain. Negative for joint pain and myalgias.  Skin:  Negative for rash.  Neurological:  Positive for sensory change (Peripheral neuropathy). Negative for dizziness, tingling, focal weakness, seizures, weakness and headaches.  Endo/Heme/Allergies:  Does not bruise/bleed easily.  Psychiatric/Behavioral:  Negative for depression and suicidal ideas. The patient does not have insomnia.       Allergies[1]   Past Medical History:  Diagnosis Date   Ectopic pregnancy    Hypertension    Miscarriage      Past Surgical History:  Procedure Laterality Date   CYST REMOVAL HAND Right    IR IMAGING GUIDED PORT INSERTION  02/27/2024   LIVER BIOPSY     SALPINGECTOMY     Etopic Pregnancy    Social History   Socioeconomic History   Marital status: Married    Spouse name: Ozell   Number of children: 3   Years of education: Not on file   Highest education level: Not on file  Occupational History   Not on file  Tobacco Use   Smoking status: Former    Types: Cigarettes   Smokeless tobacco: Never  Vaping Use   Vaping  status: Former   Quit date: 02/13/2024  Substance and Sexual Activity   Alcohol use: No   Drug use: No   Sexual activity: Yes  Other Topics Concern   Not on file  Social History Narrative   Not on file   Social Drivers of Health   Tobacco Use: Medium Risk (05/29/2024)   Patient History    Smoking Tobacco Use: Former    Smokeless Tobacco Use: Never    Passive Exposure: Not on Actuary Strain: Not on file  Food Insecurity: No Food Insecurity (02/23/2024)   Epic    Worried About Programme Researcher, Broadcasting/film/video in the Last Year: Never true    Ran Out of Food in the Last Year: Never true  Transportation Needs: No Transportation Needs (02/23/2024)   Epic    Lack of Transportation (Medical): No    Lack of Transportation (Non-Medical): No  Physical Activity: Not on file  Stress: Not on file  Social Connections: Not on file  Intimate Partner Violence: Not At Risk (02/23/2024)   Epic    Fear of Current or Ex-Partner: No    Emotionally Abused: No    Physically Abused: No    Sexually Abused: No  Depression (PHQ2-9): Low Risk (05/29/2024)   Depression (PHQ2-9)    PHQ-2 Score: 0  Alcohol Screen: Not on file  Housing: Low Risk (02/23/2024)   Epic    Unable to Pay for Housing in the Last Year: No    Number of Times Moved in  the Last Year: 0    Homeless in the Last Year: No  Utilities: Not At Risk (02/23/2024)   Epic    Threatened with loss of utilities: No  Health Literacy: Not on file    Family History  Problem Relation Age of Onset   Aneurysm Mother    Hypertension Mother    Heart Problems Father    Breast cancer Sister 21   COPD Half-Sister     Current Medications[2]  Physical exam:  Vitals:   05/29/24 0937  BP: 133/73  Pulse: 73  Resp: 20  Temp: 98.6 F (37 C)  TempSrc: Tympanic  SpO2: 96%  Weight: (!) 331 lb 11.2 oz (150.5 kg)  Height: 5' 10 (1.778 m)   Physical Exam Cardiovascular:     Rate and Rhythm: Normal rate and regular rhythm.     Heart  sounds: Normal heart sounds.  Pulmonary:     Effort: Pulmonary effort is normal.     Breath sounds: Normal breath sounds.  Skin:    General: Skin is warm and dry.  Neurological:     Mental Status: She is alert and oriented to person, place, and time.      I have personally reviewed labs listed below:    Latest Ref Rng & Units 05/29/2024    8:48 AM  CMP  Glucose 70 - 99 mg/dL 887   BUN 6 - 20 mg/dL 13   Creatinine 9.55 - 1.00 mg/dL 9.42   Sodium 864 - 854 mmol/L 139   Potassium 3.5 - 5.1 mmol/L 3.9   Chloride 98 - 111 mmol/L 102   CO2 22 - 32 mmol/L 27   Calcium  8.9 - 10.3 mg/dL 9.6   Total Protein 6.5 - 8.1 g/dL 6.8   Total Bilirubin 0.0 - 1.2 mg/dL 0.5   Alkaline Phos 38 - 126 U/L 89   AST 15 - 41 U/L 46   ALT 0 - 44 U/L 23       Latest Ref Rng & Units 05/29/2024    8:48 AM  CBC  WBC 4.0 - 10.5 K/uL 4.1   Hemoglobin 12.0 - 15.0 g/dL 85.7   Hematocrit 63.9 - 46.0 % 42.8   Platelets 150 - 400 K/uL 121    I have personally reviewed Radiology images listed below: No images are attached to the encounter.  CT CHEST ABDOMEN PELVIS W CONTRAST Result Date: 05/24/2024 CLINICAL DATA:  Metastatic rectal cancer restaging * Tracking Code: BO * EXAM: CT CHEST, ABDOMEN, AND PELVIS WITH CONTRAST TECHNIQUE: Multidetector CT imaging of the chest, abdomen and pelvis was performed following the standard protocol during bolus administration of intravenous contrast. RADIATION DOSE REDUCTION: This exam was performed according to the departmental dose-optimization program which includes automated exposure control, adjustment of the mA and/or kV according to patient size and/or use of iterative reconstruction technique. CONTRAST:  OMNIPAQUE  IOHEXOL  350 MG/ML SOLN COMPARISON:  02/18/2024 FINDINGS: CT CHEST FINDINGS Cardiovascular: Scattered aortic atherosclerosis. Cardiomegaly. Enlargement of the main pulmonary artery measuring 4.0 cm. No pericardial effusion. Mediastinum/Nodes: Diminished size  of mediastinal and hilar lymph nodes, index subcarinal node measuring 3.4 x 2.0 cm, previously 5.2 x 3.8 cm (series 2, image 29). Thyroid gland, trachea, and esophagus demonstrate no significant findings. Lungs/Pleura: Diminished size of pulmonary nodules, perihilar nodule of the right upper lobe measuring 1.1 x 0.8 cm, previously 1.7 x 1.6 cm (series 3, image 57). No pleural effusion or pneumothorax. Musculoskeletal: No chest wall abnormality. No acute osseous findings. CT ABDOMEN  PELVIS FINDINGS Hepatobiliary: Diminished size of bulky hypodense liver metastases, largest lesion occupying most of the left lobe of the liver and although ill marginated approximately 8.8 x 5.9 cm, previously 11.8 x 10.4 cm when measured similarly (series 2, image 54). Hepatomegaly, maximum coronal span 25.2 cm. Background hepatic steatosis. No gallstones, gallbladder wall thickening, or biliary dilatation. Pancreas: Unremarkable. No pancreatic ductal dilatation or surrounding inflammatory changes. Spleen: Normal in size without significant abnormality. Adrenals/Urinary Tract: Unchanged bilateral adrenal nodules, on the left measuring 3.3 x 2.6 cm (series 2, image 57). Kidneys are normal, without renal calculi, solid lesion, or hydronephrosis. Bladder is unremarkable. Stomach/Bowel: Stomach is within normal limits. Appendix appears normal. No directly visualized rectal mass. Bulky mesenteric soft tissue metastases are almost completely resolved, irregular residual on today's examination measuring 2.5 x 1.7 cm (series 2, image 77). No evidence of bowel obstruction. Vascular/Lymphatic: Aortic atherosclerosis. No enlarged abdominal or pelvic lymph nodes. Reproductive: No mass or other abnormality. Other: No abdominal wall hernia or abnormality. No ascites. Musculoskeletal: No acute osseous findings. IMPRESSION: 1. Diminished size of pulmonary nodules, mediastinal and hilar lymph nodes, and bulky liver metastases. 2. Bulky mesenteric soft  tissue metastases adjacent to the sigmoid colon and rectum are almost completely resolved. No directly visualized rectal mass. 3. Constellation of findings consistent with treatment response. 4. Unchanged bilateral adrenal nodules, probably incidental adenomata. 5. Cardiomegaly. Enlargement of the main pulmonary artery, as can be seen in pulmonary hypertension. Aortic Atherosclerosis (ICD10-I70.0). Electronically Signed   By: Marolyn JONETTA Jaksch M.D.   On: 05/24/2024 20:28     Assessment and plan- Patient is a 59 y.o. female with history of metastatic colon cancer here for on treatment assessment prior to cycle 7 of palliative FOLFOX panitumumab  chemotherapy  Assessment and Plan    Stage IV colorectal cancer with metastases - I have reviewed CT chest abdomen pelvis images independently and discussed findings with the patient Metastatic colorectal cancer involving liver, lungs, mesentery, and adrenal glands responding to FOLFOX and Vectibix . Marked reduction in lymphadenopathy, decreased hepatic and pulmonary lesions, near resolution of mesenteric involvement, stable adrenal nodules. Significant decrease in CEA. No new lesions or progression. Tolerates regimen with adverse effects.  - Continued FOLFOX and Vectibix  per current protocol. - Discontinue oxaliplatin  after twelve cycles, maintain 5-FU/leucovorin  and Vectibix . - Ordered monthly CEA monitoring; CEA checked today, next in four weeks. - Reviewed scan results with her and family; provide digital or printed scan images at next visit.  Chemotherapy-induced thrombocytopenia Mild, stable thrombocytopenia secondary to oxaliplatin , without bleeding or clinical concern. - Monitored platelet count with routine laboratory studies.  Chemotherapy-induced peripheral neuropathy Peripheral neuropathy likely due to oxaliplatin . Gabapentin  900 mg nightly provides symptomatic relief. - Continued gabapentin  900 mg nightly for neuropathy and sleep.  Pain due to  malignancy Malignancy-related pain controlled with Dilaudid  1 tablet in the morning and 1 at night. Attempted to reduce morning dosing but resumed after recent fall and hip contusion.  - Continued Dilaudid  1 tablet in the morning and 1 at night as needed for pain.  Insomnia Insomnia well-managed with trazodone  and nighttime gabapentin , resulting in improved sleep quality. - Continued trazodone  for sleep. - Continued gabapentin  at night for sleep and neuropathy.  Chemotherapy-induced nausea Chemotherapy-induced nausea controlled with two antiemetic agents as needed. - Refilled antiemetic medications as requested.         Visit Diagnosis 1. Colorectal cancer, stage IV (HCC)   2. Encounter for antineoplastic chemotherapy   3. Encounter for monoclonal antibody treatment for malignancy  4. Chemotherapy-induced peripheral neuropathy      Dr. Annah Skene, MD, MPH [facility] at [facility] [phone] 05/29/2024 12:50 PM                   [1]  Allergies Allergen Reactions   Penicillins Other (See Comments)    joints lock up  Joints lock becomes immobile, joint pain  [2]  Current Outpatient Medications:    doxycycline  (VIBRA -TABS) 100 MG tablet, Take 1 tablet (100 mg total) by mouth 2 (two) times daily., Disp: 60 tablet, Rfl: 1   gabapentin  (NEURONTIN ) 300 MG capsule, Take 1 capsule (300 mg total) by mouth 3 (three) times daily., Disp: 90 capsule, Rfl: 1   hydrocortisone  cream 1 %, Apply 1 Application topically 2 (two) times daily., Disp: 28 g, Rfl: 1   HYDROmorphone  (DILAUDID ) 2 MG tablet, Take 1 tablet (2 mg total) by mouth every 3 (three) hours., Disp: 90 tablet, Rfl: 0   lisinopril -hydrochlorothiazide  (ZESTORETIC ) 20-12.5 MG tablet, Take 1 tablet by mouth daily., Disp: 30 tablet, Rfl: 1   traZODone  (DESYREL ) 100 MG tablet, Take 1 tablet (100 mg total) by mouth at bedtime., Disp: 30 tablet, Rfl: 1   fentaNYL  (DURAGESIC ) 12 MCG/HR, Place 1 patch onto  the skin every 3 (three) days. (Patient not taking: Reported on 05/29/2024), Disp: 10 patch, Rfl: 0   gabapentin  (NEURONTIN ) 100 MG capsule, Take 1 capsule (100 mg total) by mouth at bedtime., Disp: 30 capsule, Rfl: 0   HYDROcodone  bit-homatropine (HYCODAN) 5-1.5 MG/5ML syrup, Take 5 mLs by mouth every 6 (six) hours as needed for cough. (Patient not taking: Reported on 05/01/2024), Disp: 120 mL, Rfl: 0   ondansetron  (ZOFRAN ) 8 MG tablet, Take by mouth., Disp: , Rfl:    ondansetron  (ZOFRAN ) 8 MG tablet, Take 1 tablet (8 mg total) by mouth every 8 (eight) hours as needed for nausea or vomiting. Start on the third day after chemotherapy., Disp: 30 tablet, Rfl: 1   prochlorperazine  (COMPAZINE ) 10 MG tablet, Take 10 mg by mouth every 6 (six) hours as needed., Disp: , Rfl:    prochlorperazine  (COMPAZINE ) 10 MG tablet, Take 1 tablet (10 mg total) by mouth every 6 (six) hours as needed for nausea or vomiting., Disp: 30 tablet, Rfl: 1   traZODone  (DESYREL ) 50 MG tablet, Take 1 tablet (50 mg total) by mouth at bedtime as needed for sleep., Disp: 30 tablet, Rfl: 0 No current facility-administered medications for this visit.  Facility-Administered Medications Ordered in Other Visits:    0.9 %  sodium chloride  infusion, , Intravenous, Continuous, Skene Annah BROCKS, MD, Last Rate: 10 mL/hr at 05/29/24 1039, New Bag at 05/29/24 1039   dextrose  5 % solution, , Intravenous, Continuous, Skene Annah BROCKS, MD, Last Rate: 10 mL/hr at 05/29/24 1216, New Bag at 05/29/24 1216   fluorouracil  (ADRUCIL ) 7,000 mg in sodium chloride  0.9 % 110 mL chemo infusion, 2,400 mg/m2 (Treatment Plan Recorded), Intravenous, 1 day or 1 dose, Skene Annah BROCKS, MD   fluorouracil  (ADRUCIL ) chemo injection 1,000 mg, 400 mg/m2 (Treatment Plan Recorded), Intravenous, Once, Skene Annah BROCKS, MD   leucovorin  1,056 mg in dextrose  5 % 250 mL infusion, 400 mg/m2 (Treatment Plan Recorded), Intravenous, Once, Skene Annah BROCKS, MD, Last Rate: 151 mL/hr at 05/29/24  1217, 1,056 mg at 05/29/24 1217   oxaliplatin  (ELOXATIN ) 170 mg in dextrose  5 % 500 mL chemo infusion, 65 mg/m2 (Treatment Plan Recorded), Intravenous, Once, Skene Annah BROCKS, MD, Last Rate: 267 mL/hr at 05/29/24 1220, 170  mg at 05/29/24 1220  "

## 2024-05-29 NOTE — Progress Notes (Signed)
 Patient feeling ok today. She did have 1 fall recently that bruised her left hip.

## 2024-05-30 LAB — CEA: CEA: 45.1 ng/mL — ABNORMAL HIGH (ref 0.0–4.7)

## 2024-05-31 ENCOUNTER — Inpatient Hospital Stay

## 2024-05-31 DIAGNOSIS — C19 Malignant neoplasm of rectosigmoid junction: Secondary | ICD-10-CM

## 2024-05-31 MED ORDER — SODIUM CHLORIDE 0.9% FLUSH
10.0000 mL | INTRAVENOUS | Status: DC | PRN
  Administered 2024-05-31: 10 mL
  Filled 2024-05-31: qty 10

## 2024-06-06 ENCOUNTER — Other Ambulatory Visit: Payer: Self-pay | Admitting: Oncology

## 2024-06-06 DIAGNOSIS — C19 Malignant neoplasm of rectosigmoid junction: Secondary | ICD-10-CM

## 2024-06-07 ENCOUNTER — Other Ambulatory Visit: Payer: Self-pay

## 2024-06-07 ENCOUNTER — Encounter: Payer: Self-pay | Admitting: Oncology

## 2024-06-07 MED ORDER — DOXYCYCLINE HYCLATE 100 MG PO TABS
100.0000 mg | ORAL_TABLET | Freq: Two times a day (BID) | ORAL | 1 refills | Status: AC
  Filled 2024-06-07: qty 60, 30d supply, fill #0

## 2024-06-12 ENCOUNTER — Inpatient Hospital Stay: Admitting: Oncology

## 2024-06-12 ENCOUNTER — Inpatient Hospital Stay

## 2024-06-14 ENCOUNTER — Inpatient Hospital Stay

## 2024-06-26 ENCOUNTER — Inpatient Hospital Stay: Admitting: Oncology

## 2024-06-26 ENCOUNTER — Inpatient Hospital Stay

## 2024-06-28 ENCOUNTER — Inpatient Hospital Stay

## 2024-07-18 ENCOUNTER — Inpatient Hospital Stay: Admitting: Hospice and Palliative Medicine
